# Patient Record
Sex: Female | Born: 1990 | Hispanic: No | Marital: Married | Smoking: Never smoker
Health system: Southern US, Community
[De-identification: ages and names within clinical notes are randomized; demographics above are authoritative.]

## PROBLEM LIST (undated history)

## (undated) DIAGNOSIS — R87629 Unspecified abnormal cytological findings in specimens from vagina: Secondary | ICD-10-CM

## (undated) HISTORY — DX: Unspecified abnormal cytological findings in specimens from vagina: R87.629

## (undated) HISTORY — PX: NO PAST SURGERIES: SHX2092

---

## 2017-10-18 DIAGNOSIS — K5909 Other constipation: Secondary | ICD-10-CM

## 2017-10-18 HISTORY — DX: Other constipation: K59.09

## 2018-12-16 ENCOUNTER — Other Ambulatory Visit: Payer: Self-pay | Admitting: Obstetrics and Gynecology

## 2018-12-16 DIAGNOSIS — N6320 Unspecified lump in the left breast, unspecified quadrant: Secondary | ICD-10-CM

## 2018-12-19 ENCOUNTER — Other Ambulatory Visit: Payer: Self-pay

## 2019-01-13 ENCOUNTER — Ambulatory Visit
Admission: RE | Admit: 2019-01-13 | Discharge: 2019-01-13 | Disposition: A | Discharge status: Home / Self Care | Payer: BC Managed Care – PPO | Source: Ambulatory Visit | Attending: Obstetrics and Gynecology | Admitting: Obstetrics and Gynecology

## 2019-01-13 ENCOUNTER — Encounter (HOSPITAL_COMMUNITY): Payer: Self-pay

## 2019-01-13 DIAGNOSIS — N6320 Unspecified lump in the left breast, unspecified quadrant: Secondary | ICD-10-CM | POA: Diagnosis present

## 2019-01-15 ENCOUNTER — Other Ambulatory Visit: Payer: Self-pay | Admitting: Obstetrics and Gynecology

## 2019-01-15 DIAGNOSIS — N6320 Unspecified lump in the left breast, unspecified quadrant: Secondary | ICD-10-CM

## 2019-05-15 ENCOUNTER — Ambulatory Visit: Payer: BC Managed Care – PPO | Attending: Obstetrics and Gynecology

## 2019-05-15 ENCOUNTER — Other Ambulatory Visit: Payer: Self-pay

## 2019-05-15 DIAGNOSIS — M778 Other enthesopathies, not elsewhere classified: Secondary | ICD-10-CM

## 2019-05-15 DIAGNOSIS — R102 Pelvic and perineal pain: Secondary | ICD-10-CM | POA: Insufficient documentation

## 2019-05-15 DIAGNOSIS — M62838 Other muscle spasm: Secondary | ICD-10-CM | POA: Diagnosis present

## 2019-05-15 DIAGNOSIS — K5900 Constipation, unspecified: Secondary | ICD-10-CM | POA: Insufficient documentation

## 2019-05-15 HISTORY — DX: Other enthesopathies, not elsewhere classified: M77.8

## 2019-05-15 NOTE — Therapy (Signed)
[facility] MAIN [facility] SERVICES [address] [place], [place], [address] Phone: [phone]   Fax:  [phone]  Physical Therapy Evaluation  The patient has been informed of current processes in place at Outpatient Rehab to protect patients from Covid-19 exposure including social distancing, schedule modifications, and new cleaning procedures. After discussing their particular risk with a therapist based on the patient's personal risk factors, the patient has decided to proceed with in-person therapy.   Patient Details  Name: Joyce Stewart MRN: [id number] Date of Birth: 01-May-1990 No data recorded  Encounter Date: 05/15/2019  PT End of Session - 05/16/19 0932    Visit Number  1    Number of Visits  10    Date for PT Re-Evaluation  07/25/19    Authorization - Visit Number  1    Authorization - Number of Visits  10    PT Start Time  1730    PT Stop Time  1830    PT Time Calculation (min)  60 min    Activity Tolerance  Patient tolerated treatment well;No increased pain    Behavior During Therapy  [facility] for tasks assessed/performed       History reviewed. No pertinent past medical history.  History reviewed. No pertinent surgical history.  There were no vitals filed for this visit.  Pelvic Floor Physical Therapy Evaluation and Assessment  SCREENING  Falls in last 6 mo: no Red Flags:  Have you had any night sweats? no Unexplained weight loss? no Saddle anesthesia? no Unexplained changes in bowel or bladder habits? no  SUBJECTIVE  Patient reports: Has pelvic pain that is sharp/intense ~ 2 times a day that seems to be worse if she has not had a BM that week or if she has done an Abdominal workout. It seems to have started after her IUD was placed but it is correctly in place via Ultrasound in Oct. 2020.  Constipation started ~ 6 years ago. Had fecal impaction following a vacation. Took 3 weeks of OTC Coca Cola. Takes Metamucil  daily, if she has not had a BM in 1or more weeks she takes Mirilax. Has been taking colace for ~ 4 years and started Senna ~ 6 months ago. Is not getting the urge to have a BM at all, has to track her BM's   Works out 3 day a week at a high-intensity boot-camp, does Yoga 1 day per week and walks the dog every day for 30-40 min.   Takes 5 min. Breaks, reads, goes for a walk at lunch and does yoga for stress.  Knows that she tightens her feet a lot during the day.  Precautions:  none  Social/Family/Vocational History:   Works a Ship broker.   Recent Procedures/Tests/Findings:  X-ray n 2015 that showed fecal impaction. Ultrasound in Nov. 2020  Obstetrical History: none  Gynecological History: Fibroid adenoma in L breast  Urinary History: Urinary urgency, frequency is ~ 1x/hr. 8-10 glasses per day.  3 cups of tea, 1 cup of coffee, 8-10, 1 glass of wine.   Gastrointestinal History: Has a BM 2-3 times per week over the past 2 weeks. The longest time she has gone without having a BM is ~ 3 weeks.   Sexual activity/pain: Yes, with deeper thrusting. Hurts more if she has not had a BM.  Location of pain: lower abdomen/pelvis B and taint. (vaginal deep) Current pain:  0/10  Max pain:  4-6/10 (3-4) Least pain:  0/10 Ashby Dawes of  pain: Sharp shooting stabbing  Patient Goals: To be able to have a at least BM 4 times per week. And see if pelvic pain can be resolved.   OBJECTIVE  Posture/Observations:  Sitting: Slumped, FHP Standing: R knee bent, ASIS level, PSIS level, weight on heels and outer edge of feet. Anterior pelvic tilt, glute gripping Prone: coccyx hyperflexed   Palpation/Segmental Motion/Joint Play: Decreased sacral mobility at all borders, pain on L>R,   Special tests:   Forward bend: no scoliosis noted.   Range of Motion/Flexibilty:  Spine: WNL Hips:   Strength/MMT: Deferred to follow up LE MMT  LE MMT Left Right  Hip flex:  (L2) /5 /5   Hip ext: /5 /5  Hip abd: /5 /5  Hip add: /5 /5  Hip IR /5 /5  Hip ER /5 /5     Abdominal:  Palpation:TTP to L Iliacus, B psoas (proximal) Diastasis:  none  Pelvic Floor External Exam: (performed over clothing Introitus Appears:  Skin integrity:  Palpation: TTP to B STP and IC Cough: Prolapse visible?: Scar mobility:  Internal Vaginal Exam: Deferred to follow-up Strength (PERF):  Symmetry: Palpation: Prolapse:   Internal Rectal Exam: Strength (PERF): Symmetry: Palpation: Prolapse:   Gait Analysis: deferred to follow up   Pelvic Floor Outcome Measures:  FOTO PFDI Pain: 4, Bowel Cnst: 41, PFDI Bowel: 4  INTERVENTIONS THIS SESSION: Self-care: Educated on the structure and function of the pelvic floor in relation to their symptoms as well as the POC, and initial HEP in order to set patient expectations and understanding from which we will build on in the future sessions.  Therex: Educated on and practiced child's pose and frog stretch as well as diaphragmatic breathing To improve muscle length and allow for improved balance of musculature  Acting upon the pelvis to decrease Sx.  Total time: 60 min.                  Objective measurements completed on examination: See above findings.                PT Short Term Goals - 05/16/19 0950      PT SHORT TERM GOAL #1   Title  Patient will demonstrate improved Sacral mobility and balance of musculature surrounding the pelvis to facilitate decreased PFM spasms and decrease pelvic pain.    Baseline  Anterior pelvic tilt, spasms surrounding L>R hip, LB tightness    Time  5    Period  Weeks    Status  New    Target Date  06/20/19      PT SHORT TERM GOAL #2   Title  Patient will demonstrate a coordinated contraction, relaxation, and bulge of the pelvic floor muscles to demonstrate functional recruitment and motion and allow for improved ease of BM.Marland Kitchen    Baseline  Pt. Hx. suggests poor PFM  coordination and sensation leading to chronic constipation.    Time  5    Period  Weeks    Status  New    Target Date  06/20/19      PT SHORT TERM GOAL #3   Title  Patient will demonstrate HEP x1 in the clinic to demonstrate understanding and proper form to allow for further improvement.    Baseline  Pt. lacks knowledge of what therapeutic exercises will help decrease her Sx. , Pt. does high-impact exercise 3x/wk    Time  5    Period  Weeks    Status  New  Target Date  06/20/19        PT Long Term Goals - 05/16/19 0954      PT LONG TERM GOAL #1   Title  Patient will report no pain with intercourse to demonstrate improved functional ability.    Baseline  Pt. has pain with deeper thrusting    Time  10    Period  Weeks    Status  New    Target Date  07/25/19      PT LONG TERM GOAL #2   Title  Patient will report having BM's at least every-other day with consistency between [facility] stool scale 3-5 over the prior week to demonstrate decreased constipation.    Baseline  Pt. does not have sensation to have BM, going ~ 3x/week with use of Metamucil, Senna, and colace    Time  10    Period  Weeks    Status  New    Target Date  07/25/19      PT LONG TERM GOAL #3   Title  Pt. will improve in FOTO Bowel Cnst score by 20 points to demonstrate improved function.    Baseline  FOTO PFDI Pain: 4, Bowel Cnst: 41, PFDI Bowel: 4    Time  10    Period  Weeks    Status  New    Target Date  07/25/19             Plan - 05/16/19 0932    Clinical Impression Statement  Pt. is a 29 y/o female who presents to PT today with cheif c/o chronic constipation without sensation of urge, pelvic pain, and dyspareunia. She has no relevant PMH and her clinical exam revealed a Flexed coccyx, decreased sacral mobility, PFM spasms, feet gripping, anterior pelvic tilt, and breathing dysfunction. She will benefit from skilled pelvic PT to address the noted deficits and to continue to assess for and address  other potential causes of pain and constipation.    Examination-Activity Limitations  Toileting    Examination-Participation Restrictions  Interpersonal Relationship    Stability/Clinical Decision Making  Stable/Uncomplicated    Clinical Decision Making  Low    Rehab Potential  Good    PT Frequency  1x / week    PT Duration  Other (comment)   10 weeks   PT Treatment/Interventions  ADLs/Self Care Home Management;Biofeedback;Ultrasound;Traction;Moist Heat;Electrical Stimulation;Gait training;Functional mobility training;Neuromuscular re-education;Therapeutic exercise;Therapeutic activities;Patient/family education;Manual techniques;Passive range of motion;Dry needling;Taping;Spinal Manipulations;Joint Manipulations    PT Next Visit Plan  Scaral mobs, cupping and/or TPDN to low back    PT Home Exercise Plan  child's pose, frog pose, diaphragmatic breathing    Consulted and Agree with Plan of Care  Patient       Patient will benefit from skilled therapeutic intervention in order to improve the following deficits and impairments:  Hypomobility, Improper body mechanics, Decreased activity tolerance, Decreased coordination, Increased fascial restricitons, Impaired flexibility, Postural dysfunction, Pain  Visit Diagnosis: Pelvic pain  Other muscle spasm     Problem List Patient Active Problem List   Diagnosis Date Noted  . Constipation 05/15/2019  . Tendonitis of finger 05/15/2019  . Pelvic pain 05/15/2019   Willa Rough DPT, ATC Willa Rough 05/16/2019, 9:57 AM  [place] MAIN [facility] SERVICES [address] [place], [place], [address] Phone: [phone]   Fax:  [phone]  Name: Joyce Stewart MRN: [id number] Date of Birth: [birth year]

## 2019-05-15 NOTE — Patient Instructions (Signed)
Child's Pose Pelvic Floor Lengthening    Sit in knee-chest position and reach arms forward. Separate knees for comfort. Hold position for _5__ breaths. Repeat _2-3__ times. Do _1-2__ times per day.   Take 5 deep breaths, allowing gravity to gently pull you into a deeper stretch with each breath. Repeat 2-3 times, once a day.  

## 2019-05-22 ENCOUNTER — Ambulatory Visit: Payer: BC Managed Care – PPO

## 2019-05-29 ENCOUNTER — Other Ambulatory Visit: Payer: Self-pay

## 2019-05-29 ENCOUNTER — Ambulatory Visit: Payer: BC Managed Care – PPO

## 2019-05-29 DIAGNOSIS — M62838 Other muscle spasm: Secondary | ICD-10-CM

## 2019-05-29 DIAGNOSIS — R102 Pelvic and perineal pain: Secondary | ICD-10-CM

## 2019-05-29 NOTE — Patient Instructions (Addendum)
This may look different for you now because you are still balancing at the point where you feel the stretch, working your way forward toward putting your knees down.    Bowel retraining program: 1) Start by drinking a hot (optionally caffeinated) beverage 2) Do your "I love you" colonic massage  3) Go for a short walk 4) Go to the toilet and sit with feet up on squatty potty and relaxing forward on your knees with tall spine. Take deep, lengthening, breaths and allow up to 10 minutes to have a BM without "straining" before you move on with the day.       The "I Love You" massage for your colon  Start by resting or lying quietly. 1. Using your fingertips, you apply light pressure in a stroking motion. 2. Start with your hands on the left hand side of your abdomen, below the rib cage, and stroke or make small circles down towards your left hip. This is the "I" of the "I Love You" massage. 3. Next, you are going to make the strokes in an upside down "L" shape. Run your fingertips from the right side of your upper abdomen, across under your ribs, and down the left side. 4. Now you are going to run through the whole path. This is the "U". Start on the bottom right of your abdomen. Stroke up the right side, across under the rib cage, and down the left side.   5. Finally, Using your fingertips, you apply light pressure in small circles  through the whole "U" path to "wake up" the smooth muscles of the intestines and get things moving.    Up the right, across under the rib cage, down the left and inwards, moving in a clockwise motion (if you are looking down upon your own abdomen)  Essentially you are massaging along the path of your large intestine. Our colon starts roughly in the bottom right of our abdomen, travels up the right hand side, turns and runs across below our rib cage, and then down the left side and in towards the pubic bone. When I teach this massage for people to do at home  I have them start with 10 minutes. However, anecdotally, many people tell me 15-20 minutes really gets things going! After about 5 minutes of this massage my insides start gurgling and making noises. For many years I worked as a Adult nurse in a hospital setting. A big problem is constipation resulting from either medication side effects, post surgical changes, or the fact that in general people in the hospital don't move as much (and exercise such as walking also helps regulate our digestive system). One of the first "exercises" I would teach them is how to do the "I Love You" abdominal massage. Time and time again I have people come back to me and say that massaging their abdominal tissue helped their digestive issues. Give it a try today!  *Adapted from article written by Harriet Butte, PT, DPT  Flexors, Lunge  Hip Flexor Stretch: Proposal Pose    Maintain pelvic tuck under, lift pubic bone toward navel. Engage posterior hip muscles (firm glute muscles of leg in back position) and shift forward until you feel stretch on front of leg that is down. To increase stretch, maintain balance and ease hips forward. You may use one hand on a chair for balance if needed. Hold for __5__ breaths. Repeat __2-3__ times each leg.  Do _1-2__ times per day.  **AVOID JUMPING  EXERCISE FOR AT LEAST 2 WEEKS.

## 2019-05-29 NOTE — Therapy (Addendum)
[place] [facility] MAIN [facility] SERVICES [address] [place], [place], [address] Phone: [phone]   Fax:  [phone]  Physical Therapy Treatment  The patient has been informed of current processes in place at Outpatient Rehab to protect patients from Covid-19 exposure including social distancing, schedule modifications, and new cleaning procedures. After discussing their particular risk with a therapist based on the patient's personal risk factors, the patient has decided to proceed with in-person therapy.   Patient Details  Name: Joyce Stewart MRN: [id number] Date of Birth: [birth year] No data recorded  Encounter Date: 05/29/2019  PT End of Session - 05/30/19 1611    Visit Number  2    Number of Visits  10    Date for PT Re-Evaluation  07/25/19    Authorization - Visit Number  2    Authorization - Number of Visits  10    PT Start Time  1705    PT Stop Time  1809    PT Time Calculation (min)  64 min    Activity Tolerance  Patient tolerated treatment well;No increased pain    Behavior During Therapy  [facility] for tasks assessed/performed       No past medical history on file.  No past surgical history on file.  There were no vitals filed for this visit.    Pelvic Floor Physical Therapy Treatment Note  SCREENING  Changes in medications, allergies, or medical history?: none    SUBJECTIVE  Patient reports: No change since prior visit.  Precautions:  none  Sexual activity/pain: Yes, with deeper thrusting. Hurts more if she has not had a BM.  Location of pain: lower abdomen/pelvis B and taint. (vaginal deep) Current pain: 0/10  Max pain: 4-6/10 Least pain: 0/10 Nature of pain:Sharp shooting stabbing  ** no pain following session  Patient Goals: To be able to have a at least BM 4 times per week. And see if pelvic pain can be resolved.  OBJECTIVE  Changes in: Posture/Observations:  hyperlordosis  Range of Motion/Flexibilty:   Decreased mobility through T-L junction with referred pressure into abdomen an pelvis.  Strength/MMT:  LE MMT:  Pelvic floor:  Abdominal:  Decreased rib and abdominal excursion with Inhale  Palpation:  Decreased fascial mobility through T-L junction without tenderness.  Gait Analysis:  INTERVENTIONS THIS SESSION: Self-care:  Discussed decreasing high-intensity exercise short-term (boot camp) to improve relaxation of PFM, educated on bowel retraining, squatty potty, ILY colon masssage toe-yoga Manual: perfomed sacral and T-L junction mobs and cupping to low/mid back to improve mobility of joint and surrounding connective tissue and decrease pressure on nerve roots for improved conductivity and function of down-stream tissues.  Therex: Educated on and practiced plantar fascia yoga stretch and hip-flexor stretch to decrease tension on nerves that innervate or affect PFM tension.  Total time: 64 min.                            PT Short Term Goals - 05/16/19 0950      PT SHORT TERM GOAL #1   Title  Patient will demonstrate improved Sacral mobility and balance of musculature surrounding the pelvis to facilitate decreased PFM spasms and decrease pelvic pain.    Baseline  Anterior pelvic tilt, spasms surrounding L>R hip, LB tightness    Time  5    Period  Weeks    Status  New    Target Date  06/20/19  PT SHORT TERM GOAL #2   Title  Patient will demonstrate a coordinated contraction, relaxation, and bulge of the pelvic floor muscles to demonstrate functional recruitment and motion and allow for improved ease of BM.Marland Kitchen    Baseline  Pt. Hx. suggests poor PFM coordination and sensation leading to chronic constipation.    Time  5    Period  Weeks    Status  New    Target Date  06/20/19      PT SHORT TERM GOAL #3   Title  Patient will demonstrate HEP x1 in the clinic to demonstrate understanding and proper form to allow for further improvement.    Baseline   Pt. lacks knowledge of what therapeutic exercises will help decrease her Sx. , Pt. does high-impact exercise 3x/wk    Time  5    Period  Weeks    Status  New    Target Date  06/20/19        PT Long Term Goals - 05/16/19 0954      PT LONG TERM GOAL #1   Title  Patient will report no pain with intercourse to demonstrate improved functional ability.    Baseline  Pt. has pain with deeper thrusting    Time  10    Period  Weeks    Status  New    Target Date  07/25/19      PT LONG TERM GOAL #2   Title  Patient will report having BM's at least every-other day with consistency between [facility] stool scale 3-5 over the prior week to demonstrate decreased constipation.    Baseline  Pt. does not have sensation to have BM, going ~ 3x/week with use of Metamucil, Senna, and colace    Time  10    Period  Weeks    Status  New    Target Date  07/25/19      PT LONG TERM GOAL #3   Title  Pt. will improve in FOTO Bowel Cnst score by 20 points to demonstrate improved function.    Baseline  FOTO PFDI Pain: 4, Bowel Cnst: 41, PFDI Bowel: 4    Time  10    Period  Weeks    Status  New    Target Date  07/25/19            Plan - 05/30/19 1611    Clinical Impression Statement  Pt. Responded well to all interventions today, demonstrating improved T-L junction and sacral mobility, improved fascial mobility,  as well as understanding and correct performance of all education and exercises provided today. They will continue to benefit from skilled physical therapy to work toward remaining goals and maximize function as well as decrease likelihood of symptom increase or recurrence.     PT Next Visit Plan  TPDN to low back, hip flexors and adductors PRN    PT Home Exercise Plan  child's pose, frog pose, diaphragmatic breathing,       Patient will benefit from skilled therapeutic intervention in order to improve the following deficits and impairments:     Visit Diagnosis: Pelvic pain  Other muscle  spasm     Problem List Patient Active Problem List   Diagnosis Date Noted  . Constipation 05/15/2019  . Tendonitis of finger 05/15/2019  . Pelvic pain 05/15/2019   Cleophus Molt DPT, ATC Cleophus Molt 05/30/2019, 4:16 PM  [place] [facility] MAIN [facility] SERVICES [address] [place], [place], [address] Phone:  [phone]   Fax:  [phone]  Name: Melisha Eggleton MRN: [id number] Date of Birth: [birth year]

## 2019-06-01 ENCOUNTER — Ambulatory Visit: Payer: BC Managed Care – PPO | Attending: Internal Medicine

## 2019-06-01 DIAGNOSIS — Z23 Encounter for immunization: Secondary | ICD-10-CM | POA: Insufficient documentation

## 2019-06-01 NOTE — Progress Notes (Signed)
   Covid-19 Vaccination Clinic  Name:  Athalee Esterline    MRN: [id number] DOB: 1991/02/29  06/01/2019  Ms. Webb Silversmith was observed post Covid-19 immunization for 15 minutes without incidence. She was provided with Vaccine Information Sheet and instruction to access the V-Safe system.   Ms. Webb Silversmith was instructed to call 911 with any severe reactions post vaccine: Marland Kitchen Difficulty breathing  . Swelling of your face and throat  . A fast heartbeat  . A bad rash all over your body  . Dizziness and weakness    Immunizations Administered    Name Date Dose VIS Date Route   Pfizer COVID-19 Vaccine 06/01/2019 12:01 PM 0.3 mL 03/14/2019 Intramuscular   Manufacturer: [employer], [employer]   Lot: [id number]   NDC: [address]-9

## 2019-06-05 ENCOUNTER — Ambulatory Visit: Payer: BC Managed Care – PPO | Attending: Obstetrics and Gynecology

## 2019-06-05 ENCOUNTER — Other Ambulatory Visit: Payer: Self-pay

## 2019-06-05 DIAGNOSIS — M62838 Other muscle spasm: Secondary | ICD-10-CM | POA: Diagnosis present

## 2019-06-05 DIAGNOSIS — R102 Pelvic and perineal pain: Secondary | ICD-10-CM | POA: Diagnosis present

## 2019-06-05 NOTE — Patient Instructions (Signed)
Pelvic Tilt With Pelvic Floor (Hook-Lying)        Lie with hips and knees bent. Squeeze lower tummy muscles and flatten low back  (tuck under) while breathing out (like you are blowing out candles) so that pelvis tilts. Inhale and relax. Repeat _15x2__ times. Do __1-2_ times a day.  *low tummy muscles first, then ribcage

## 2019-06-05 NOTE — Therapy (Signed)
[place] [facility] MAIN [facility] SERVICES [address] [place], [place], [address] Phone: [phone]   Fax:  [phone]  Physical Therapy Treatment  The patient has been informed of current processes in place at Outpatient Rehab to protect patients from Covid-19 exposure including social distancing, schedule modifications, and new cleaning procedures. After discussing their particular risk with a therapist based on the patient's personal risk factors, the patient has decided to proceed with in-person therapy.   Patient Details  Name: Joyce Stewart MRN: [id number] Date of Birth: 04-01-1991 No data recorded  Encounter Date: 06/05/2019  PT End of Session - 06/06/19 1031    Visit Number  3    Number of Visits  10    Date for PT Re-Evaluation  07/25/19    Authorization - Visit Number  3    Authorization - Number of Visits  10    PT Start Time  1730    PT Stop Time  1830    PT Time Calculation (min)  60 min    Activity Tolerance  Patient tolerated treatment well;No increased pain    Behavior During Therapy  [facility] for tasks assessed/performed       No past medical history on file.  No past surgical history on file.  There were no vitals filed for this visit.     Pelvic Floor Physical Therapy Treatment Note  SCREENING  Changes in medications, allergies, or medical history?: none    SUBJECTIVE  Patient reports: Has been having pain in B feet and pelvis. Exercised yesterday and for the felt to the first time like her muscles in her back were sore/tired. It was just while she was exercising. Has been working on the toileting technique but did not do it last night. She had a BM today and on Monday but was "pooping pebbles" both times.  Drinking 7-9 cups of water mostly between 3-10 pm.     Precautions:  none  Sexual activity/pain: Yes, with deeper thrusting. Hurts more if she has not had a BM.  Location of pain: lower abdomen/pelvis B and taint.  (vaginal deep) Current pain: 0/10  Max pain: 7/10 Least pain: 0/10 Nature of pain:Sharp shooting stabbing  ** sore in the back from where we worked   Patient Goals: To be able to have a at least BM 4 times per week. And see if pelvic pain can be resolved.  OBJECTIVE  Changes in: Posture/Observations:  hyperlordosis  Range of Motion/Flexibilty:  Decreased mobility through T-L junction with referred pressure into abdomen an pelvis.  Strength/MMT:  LE MMT:  Pelvic floor:  Abdominal:  Decreased rib and abdominal excursion with Inhale/exhale, difficulty recruiting and sustaining TA contraction (improved following treatment by ~ 30%).  2 finger diastasis under umbilicus, 1 finger above.  Palpation: TTP to B Iliacus and Psoas  Gait Analysis:  INTERVENTIONS THIS SESSION: Manual: Performed TP release to B Iliacus and Psoas to decrease spasm and pain and allow for decreased tension on the T/L junction and genitofemoral nerve for Improved function and decreased symptoms.  Therex: Educated on and practiced deep core recruitment (posterior pelvic tilts with TA then obliques), and educated on and practiced mini-marches (but not given as HEP yet) to improve strength of muscles opposing tight musculature to allow reciprocal inhibition to improve balance of deep-core musculature and improve overall posture for optimal musculature length-tension relationship and function.  Self-care: Educated on diastasis and how to modify exercises to make sure it does not worsen. Discussed  taking mirilax regularly until she has a BM and then on a schedule to maintain smaller, more regular BM's and help the colon shrink back down to a normal size as we get the diastasis and spine worked out to help her maintain improved regularity.  Total time: 60 min.                           PT Short Term Goals - 05/16/19 0950      PT SHORT TERM GOAL #1   Title  Patient will demonstrate  improved Sacral mobility and balance of musculature surrounding the pelvis to facilitate decreased PFM spasms and decrease pelvic pain.    Baseline  Anterior pelvic tilt, spasms surrounding L>R hip, LB tightness    Time  5    Period  Weeks    Status  New    Target Date  06/20/19      PT SHORT TERM GOAL #2   Title  Patient will demonstrate a coordinated contraction, relaxation, and bulge of the pelvic floor muscles to demonstrate functional recruitment and motion and allow for improved ease of BM.Marland Kitchen    Baseline  Pt. Hx. suggests poor PFM coordination and sensation leading to chronic constipation.    Time  5    Period  Weeks    Status  New    Target Date  06/20/19      PT SHORT TERM GOAL #3   Title  Patient will demonstrate HEP x1 in the clinic to demonstrate understanding and proper form to allow for further improvement.    Baseline  Pt. lacks knowledge of what therapeutic exercises will help decrease her Sx. , Pt. does high-impact exercise 3x/wk    Time  5    Period  Weeks    Status  New    Target Date  06/20/19        PT Long Term Goals - 05/16/19 0954      PT LONG TERM GOAL #1   Title  Patient will report no pain with intercourse to demonstrate improved functional ability.    Baseline  Pt. has pain with deeper thrusting    Time  10    Period  Weeks    Status  New    Target Date  07/25/19      PT LONG TERM GOAL #2   Title  Patient will report having BM's at least every-other day with consistency between [facility] stool scale 3-5 over the prior week to demonstrate decreased constipation.    Baseline  Pt. does not have sensation to have BM, going ~ 3x/week with use of Metamucil, Senna, and colace    Time  10    Period  Weeks    Status  New    Target Date  07/25/19      PT LONG TERM GOAL #3   Title  Pt. will improve in FOTO Bowel Cnst score by 20 points to demonstrate improved function.    Baseline  FOTO PFDI Pain: 4, Bowel Cnst: 41, PFDI Bowel: 4    Time  10    Period   Weeks    Status  New    Target Date  07/25/19            Plan - 06/06/19 1031    Clinical Impression Statement  Pt. Responded well to all interventions today, demonstrating improved deep-core recruitment, decreased spasm and pain, and as well as understanding and correct performance  of all education and exercises provided today. They will continue to benefit from skilled physical therapy to work toward remaining goals and maximize function as well as decrease likelihood of symptom increase or recurrence.     PT Next Visit Plan  TPDN to low back/TL junction, adductors PRN, increase deep-core challenge    PT Home Exercise Plan  child's pose, frog pose, diaphragmatic breathing, toe stretch, hip-flexor stretch, Posterior pelvic tilt with TA/Oblique timing    Consulted and Agree with Plan of Care  Patient       Patient will benefit from skilled therapeutic intervention in order to improve the following deficits and impairments:     Visit Diagnosis: Pelvic pain  Other muscle spasm     Problem List Patient Active Problem List   Diagnosis Date Noted  . Constipation 05/15/2019  . Tendonitis of finger 05/15/2019  . Pelvic pain 05/15/2019   Willa Rough DPT, ATC Willa Rough 06/06/2019, 10:34 AM  [place] MAIN [facility] SERVICES [address] [place], [place], [address] Phone: [phone]   Fax:  [phone]  Name: Joyce Stewart MRN: [id number] Date of Birth: 03-19-1991

## 2019-06-12 ENCOUNTER — Ambulatory Visit: Payer: BC Managed Care – PPO

## 2019-06-12 ENCOUNTER — Other Ambulatory Visit: Payer: Self-pay

## 2019-06-12 DIAGNOSIS — M62838 Other muscle spasm: Secondary | ICD-10-CM

## 2019-06-12 DIAGNOSIS — R102 Pelvic and perineal pain: Secondary | ICD-10-CM | POA: Diagnosis not present

## 2019-06-12 NOTE — Therapy (Signed)
[place] [facility] MAIN [facility] SERVICES [address] [place], [place], [address] Phone: [phone]   Fax:  [phone]  Physical Therapy Treatment  The patient has been informed of current processes in place at Outpatient Rehab to protect patients from Covid-19 exposure including social distancing, schedule modifications, and new cleaning procedures. After discussing their particular risk with a therapist based on the patient's personal risk factors, the patient has decided to proceed with in-person therapy.   Patient Details  Name: Joyce Stewart MRN: [id number] Date of Birth: 04-28-1990 No data recorded  Encounter Date: 06/12/2019  PT End of Session - 06/12/19 1735    Visit Number  4    Number of Visits  10    Date for PT Re-Evaluation  07/25/19    Authorization - Visit Number  4    Authorization - Number of Visits  10    Progress Note Due on Visit  10    PT Start Time  1730    PT Stop Time  1830    PT Time Calculation (min)  60 min    Activity Tolerance  Patient tolerated treatment well;No increased pain    Behavior During Therapy  [facility] for tasks assessed/performed       No past medical history on file.  No past surgical history on file.  There were no vitals filed for this visit.     Pelvic Floor Physical Therapy Treatment Note  SCREENING  Changes in medications, allergies, or medical history?: none    SUBJECTIVE  Patient reports: Thinks she needs to stop the toileting routine because she thinks she gave herself a hemmorhoid. Did not listen to the part about relaxing during the time she is sitting for 10 minutes. Had a BM on Friday, Saturday, Monday and Wednesday. Has take Mrilax every-other day. Has vaginal/period cramps that come and go. Her feet are still really achy, just around the heels. It is most painful when she is barefoot.   Precautions:  none  Sexual activity/pain: Yes, with deeper thrusting. Hurts more if she has not had  a BM.  Location of pain: lower abdomen/pelvis B and taint. (vaginal deep) Current pain: 0/10  Max pain: 4/10 Least pain: 0/10 Nature of pain:Sharp shooting stabbing  ** sore in the back from where we worked   Patient Goals: To be able to have a at least BM 4 times per week. And see if pelvic pain can be resolved.  OBJECTIVE  Changes in: Posture/Observations:  hyperlordosis  Range of Motion/Flexibilty:  Decreased mobility through T-L junction with referred pressure into abdomen an pelvis.  Strength/MMT:  LE MMT:  Pelvic floor: Range of Motion/Flexibilty:  Spine: Hips:   Strength/MMT:  LE MMT  LE MMT Left Right  Hip flex:  (L2) /5 /5  Hip ext: /5 /5  Hip abd: /5 /5  Hip add: /5 /5  Hip IR /5 /5  Hip ER /5 /5     Abdominal:  Palpation: Diastasis:  Pelvic Floor External Exam: Introitus Appears: WNL Skin integrity: WNL Palpation: no TTP Cough: not assessed Prolapse visible?: none Scar mobility: N/A  Internal Vaginal Exam: Strength (PERF): 5/5, 5 sec, 1 time Symmetry: L>R for TTP, greatest at L posterior PR/PC Palpation: TTP throughout all PFM Prolapse: none   Internal Rectal Exam: Deferred indefinately Strength (PERF): Symmetry: Palpation: Prolapse:   Abdominal:  Decreased rib and abdominal excursion with Inhale/exhale, difficulty recruiting and sustaining TA contraction (improved following treatment by ~ 30%).  2 finger  diastasis under umbilicus, 1 finger above.  Palpation: TTP to B Iliacus and Psoas  Gait Analysis:  INTERVENTIONS THIS SESSION: Manual:  TP release to B adductor revis/pectineus, grade 3-4 PA mobs to sacrum, palpated lumbar extensors, assessed PFM and performed TP release to R Posterior and anterior PR/PC, and posterior fourchette to decrease spasm and pain and allow for improved balance of musculature for improved function and decreased symptoms.  Therex: Educated on and practiced standing calf-stretch on the wall to  decrease tension through plantar fascia and decrease heel pain, reviewed deep-core coordination.  Self-care: Educated on toilet meditation, using preparation H, not wearing pedicure separators all the time, not straining on the toilet, and self posterior fourchette TP release to allow for ease of BM and decreased constipation and  hemorrhoids.   Total time: 60 min.                           PT Short Term Goals - 05/16/19 0950      PT SHORT TERM GOAL #1   Title  Patient will demonstrate improved Sacral mobility and balance of musculature surrounding the pelvis to facilitate decreased PFM spasms and decrease pelvic pain.    Baseline  Anterior pelvic tilt, spasms surrounding L>R hip, LB tightness    Time  5    Period  Weeks    Status  New    Target Date  06/20/19      PT SHORT TERM GOAL #2   Title  Patient will demonstrate a coordinated contraction, relaxation, and bulge of the pelvic floor muscles to demonstrate functional recruitment and motion and allow for improved ease of BM.Marland Kitchen    Baseline  Pt. Hx. suggests poor PFM coordination and sensation leading to chronic constipation.    Time  5    Period  Weeks    Status  New    Target Date  06/20/19      PT SHORT TERM GOAL #3   Title  Patient will demonstrate HEP x1 in the clinic to demonstrate understanding and proper form to allow for further improvement.    Baseline  Pt. lacks knowledge of what therapeutic exercises will help decrease her Sx. , Pt. does high-impact exercise 3x/wk    Time  5    Period  Weeks    Status  New    Target Date  06/20/19        PT Long Term Goals - 05/16/19 0954      PT LONG TERM GOAL #1   Title  Patient will report no pain with intercourse to demonstrate improved functional ability.    Baseline  Pt. has pain with deeper thrusting    Time  10    Period  Weeks    Status  New    Target Date  07/25/19      PT LONG TERM GOAL #2   Title  Patient will report having BM's at least  every-other day with consistency between [facility] stool scale 3-5 over the prior week to demonstrate decreased constipation.    Baseline  Pt. does not have sensation to have BM, going ~ 3x/week with use of Metamucil, Senna, and colace    Time  10    Period  Weeks    Status  New    Target Date  07/25/19      PT LONG TERM GOAL #3   Title  Pt. will improve in FOTO Bowel Cnst score by 20  points to demonstrate improved function.    Baseline  FOTO PFDI Pain: 4, Bowel Cnst: 41, PFDI Bowel: 4    Time  10    Period  Weeks    Status  New    Target Date  07/25/19            Plan - 06/12/19 1737    Clinical Impression Statement  Pt. Responded well to all interventions today, demonstrating decreased PFM spasm as well as understanding and correct performance of all education and exercises provided today. They will continue to benefit from skilled physical therapy to work toward remaining goals and maximize function as well as decrease likelihood of symptom increase or recurrence.     PT Next Visit Plan  Further internal TP release, TPDN to low back/TL junction to allow for improved sensation PRN, increase deep-core challenge    PT Home Exercise Plan  child's pose, frog pose, diaphragmatic breathing, toe stretch, hip-flexor stretch, Posterior pelvic tilt with TA/Oblique timing, toilet meditation, self posterior fourchette release, calf stretch on wall    Consulted and Agree with Plan of Care  Patient       Patient will benefit from skilled therapeutic intervention in order to improve the following deficits and impairments:     Visit Diagnosis: Pelvic pain  Other muscle spasm     Problem List Patient Active Problem List   Diagnosis Date Noted  . Constipation 05/15/2019  . Tendonitis of finger 05/15/2019  . Pelvic pain 05/15/2019   Cleophus Molt DPT, ATC Cleophus Molt 06/13/2019, 9:30 AM  [place] [facility] MAIN [facility] SERVICES [address]  [place], [place], [address] Phone: [phone]   Fax:  [phone]  Name: Joyce Stewart MRN: [id number] Date of Birth: March 16, 1991

## 2019-06-12 NOTE — Patient Instructions (Addendum)
1) don't strain while on the toilet!  This meditation is from [employer], physical therapist and yoga instructor.  Patients, yoga students and pelvic health practitioners that I have shared this with have seemed to really enjoy it, find it easy to practice and teach, and report its usefulness! It involves 6 stages, using the acronym "AIRBAG" to help you remember! If you are sitting on the toilet, it is a good idea to place your feet up on some blocks so that your knees are slightly higher than your hips. This position can help enhance your PFMs to relax and allow for proper elimination, particularly for bowel movements. If you are standing during urination, these toilet meditation stages of "AIRBAG" can still be performed: A = Awareness: become present and connected to your body as best as you can. A brief body scan from head to toe simply observing sensations that you may be experiencing, without judgement, both internally (interoceptive awareness) and externally (such as the sensations at the soles of the feet weight bearing on the supporting surface, or sensations at the thighs as they weight bear on the toilet seat, or how you are holding your arms, or any tension in the jaw or shoulders). You may include awareness of thoughts and emotions, without elaborating on a story or analyzing. I = Imagination: use your visualization skills to imagine your pelvic floor and the general area of attachments of the PFMs to the inside of the front, sides and back of the pelvis, the tailbone and sacrum. Visualize where the bladder and bowel are positioned and imagine them emptying and that the PFMs spanning across the pelvic floor are healthy and functioning optimally. R = Release & Relax: let go of any tension in the PFMs as best as you can. Releasing and relaxing these muscles can sometimes be difficult for a variety of reasons. Sometimes 'trying too hard' to relax and let go creates even more tension. Be patient  and compassionate towards yourself if you have trouble with this. Letting go often takes courage, trust, concentration and practise. B = Breathe: allow your natural breath pattern to emerge. Sometimes when we try to breathe, we create more tension that results in unnatural patterns that do not serve a relaxed state. As you quietly inhale, the belly will naturally protrude outwards or forward and the pelvic floor will descend. As you exhale, the belly and PFMs will return to their resting positions. During toileting, see if you can simply allow the quiet rhythm of the abdomino-pelvic diaphragmatic breath to happen on its own without trying to change it. A = Allow: this 'allowing' stage is a little more than just releasing and relaxing or allowing the breath to happen on its own. See if you can really give yourself permission to trust that your body knows what to do and when to do it. Perhaps you feel the need to push gently (do not strain) or you feel like you want to take a deep breath, sigh out loud, lean forward, or place your feet in a different position. The more refined your awareness skills are, the more you can trust what feels right, and not always what you think you should do. G = Gratitude: I think it is a healthy practice to not only be completely present and mindful when toileting, but to also honour this sophisticated and truly complex function that our body does for [place] on a daily basis without [place] even asking it to. So each time you complete your  toileting event, I invite you to send a little gratitude to your body and all its incredibly phenomenal parts to end your toilet meditation ! There is a FREE bonus feature of the full guided Toilet Meditation (with music and breathtaking cinematography) included along with the "Creating Pelvic Floor Health with PhysioYoga" practice sessions which consist of a combination of physical therapy based exercises and yoga methods targeted to optimize pelvic floor  health. I also have a free brief segment of the meditation on my YouTube channel.  2) Only wear the pedicure thing for ~ 20-30 min. At the end of the day while resting.  3) Hold for 5 deep breaths, repeat 2-3 times on each side.  4) Self Posterior Fourchette Stretching/Mobilization    1) Wash your hands and prop your body up so you can easily reach the vagina, bring hand-held mirror if desired.  2) Apply lubricant to the thumb and vaginal opening  3) Place thumb ~ 1/2 an inch into the vagina with the pad of the thumb pointed down and apply gentle pressure to the posterior fourchette.  4) Gently press, maintaining pressure down toward the anus. Make sure the pressure is not so great that your muscles tighten up and guard, just enough to create slight discomfort.  Do this for ~ 3 min. Or until you feel release, do this every 2-3 nights or until no more tenderness with pressure to decrease tightness and tenderness at the vaginal opening.

## 2019-06-19 ENCOUNTER — Ambulatory Visit: Payer: BC Managed Care – PPO

## 2019-06-24 ENCOUNTER — Ambulatory Visit: Payer: BC Managed Care – PPO | Attending: Internal Medicine

## 2019-06-24 DIAGNOSIS — Z23 Encounter for immunization: Secondary | ICD-10-CM

## 2019-06-24 NOTE — Progress Notes (Signed)
   Covid-19 Vaccination Clinic  Name:  Joyce Stewart    MRN: [id number] DOB: 04-02-1991  06/24/2019  Ms. Joyce Stewart was observed post Covid-19 immunization for 15 minutes without incident. She was provided with Vaccine Information Sheet and instruction to access the V-Safe system.   Ms. Joyce Stewart was instructed to call 911 with any severe reactions post vaccine: Marland Kitchen Difficulty breathing  . Swelling of face and throat  . A fast heartbeat  . A bad rash all over body  . Dizziness and weakness   Immunizations Administered    Name Date Dose VIS Date Route   Pfizer COVID-19 Vaccine 06/24/2019  3:12 PM 0.3 mL 03/14/2019 Intramuscular   Manufacturer: [employer], [employer]   Lot: [id number]   NDC: [address]-1

## 2019-06-26 ENCOUNTER — Ambulatory Visit: Payer: BC Managed Care – PPO

## 2019-06-26 DIAGNOSIS — R102 Pelvic and perineal pain: Secondary | ICD-10-CM | POA: Diagnosis not present

## 2019-06-26 NOTE — Therapy (Signed)
[place] [facility] MAIN [facility] SERVICES [address] [place], [place], [address] Phone: [phone]   Fax:  [phone]  Physical Therapy Treatment  The patient has been informed of current processes in place at Outpatient Rehab to protect patients from Covid-19 exposure including social distancing, schedule modifications, and new cleaning procedures. After discussing their particular risk with a therapist based on the patient's personal risk factors, the patient has decided to proceed with in-person therapy.   Patient Details  Name: Joyce Stewart MRN: [id number] Date of Birth: Dec 12, 1990 No data recorded  Encounter Date: 06/26/2019  PT End of Session - 06/27/19 1108    Visit Number  5    Number of Visits  10    Date for PT Re-Evaluation  07/25/19    Authorization - Visit Number  5    Authorization - Number of Visits  10    Progress Note Due on Visit  10    PT Start Time  1740    PT Stop Time  1835    PT Time Calculation (min)  55 min    Activity Tolerance  Patient tolerated treatment well;No increased pain    Behavior During Therapy  [facility] for tasks assessed/performed       No past medical history on file.  No past surgical history on file.  There were no vitals filed for this visit.    Pelvic Floor Physical Therapy Treatment Note  SCREENING  Changes in medications, allergies, or medical history?: got her second vaccine.    SUBJECTIVE  Patient reports: Toileting has been going better but she is still taking Mirilax every-other day. She still has some foot ache. Sometimes her low back feels tired. Did have one day where she actually had a sensation to have a BM. Did the self posterior fourchette release 2 times. Last night it felt like the pelvic tilts were getting a little bit easier.   Precautions:  none  Sexual activity/pain: Yes, with deeper thrusting. Hurts more if she has not had a BM.  Location of pain: lower abdomen/pelvis B and  taint. (vaginal deep) Current pain: 0/10  Max pain: 0/10 Least pain: 0/10 Nature of pain:Sharp shooting stabbing   Patient Goals: To be able to have a at least BM 4 times per week. And see if pelvic pain can be resolved.  OBJECTIVE  Changes in: Posture/Observations:  hyperlordosis  Range of Motion/Flexibilty:  Decreased mobility through T-L junction with referred pressure into abdomen an pelvis.  Strength/MMT:  LE MMT:   Pelvic Floor External Exam: Introitus Appears: WNL Skin integrity: WNL Palpation: no TTP Cough: not assessed Prolapse visible?: none Scar mobility: N/A  Internal Vaginal Exam: Strength (PERF): 5/5, 5 sec, 1 time Symmetry: L>R for TTP, greatest at L posterior PR/PC Palpation: TTP throughout all PFM Prolapse: none  TODAY: TTP to L anterior and posterior PR/PC, OI, coccygeus, and IC with some referred pain into lower abdomen with anterior PR/PC palpation.  Abdominal:  Decreased rib and abdominal excursion with Inhale/exhale, difficulty recruiting and sustaining TA contraction (improved following treatment by ~ 30%).  2 finger diastasis under umbilicus, 1 finger above. (from prior session)  Palpation:  Gait Analysis:  INTERVENTIONS THIS SESSION: Manual:  TP release to all internal PFM muscles on the L to decrease spasm and pain and allow for improved balance of musculature for improved function and decreased symptoms.  Self-care: Educated Pt. On the importance of continuing to stay regular to allow for returned BM sensation regularly  an dto continue with HEP.   Total time: 60 min.                            PT Short Term Goals - 05/16/19 0950      PT SHORT TERM GOAL #1   Title  Patient will demonstrate improved Sacral mobility and balance of musculature surrounding the pelvis to facilitate decreased PFM spasms and decrease pelvic pain.    Baseline  Anterior pelvic tilt, spasms surrounding L>R hip, LB tightness     Time  5    Period  Weeks    Status  New    Target Date  06/20/19      PT SHORT TERM GOAL #2   Title  Patient will demonstrate a coordinated contraction, relaxation, and bulge of the pelvic floor muscles to demonstrate functional recruitment and motion and allow for improved ease of BM.Marland Kitchen    Baseline  Pt. Hx. suggests poor PFM coordination and sensation leading to chronic constipation.    Time  5    Period  Weeks    Status  New    Target Date  06/20/19      PT SHORT TERM GOAL #3   Title  Patient will demonstrate HEP x1 in the clinic to demonstrate understanding and proper form to allow for further improvement.    Baseline  Pt. lacks knowledge of what therapeutic exercises will help decrease her Sx. , Pt. does high-impact exercise 3x/wk    Time  5    Period  Weeks    Status  New    Target Date  06/20/19        PT Long Term Goals - 05/16/19 0954      PT LONG TERM GOAL #1   Title  Patient will report no pain with intercourse to demonstrate improved functional ability.    Baseline  Pt. has pain with deeper thrusting    Time  10    Period  Weeks    Status  New    Target Date  07/25/19      PT LONG TERM GOAL #2   Title  Patient will report having BM's at least every-other day with consistency between [facility] stool scale 3-5 over the prior week to demonstrate decreased constipation.    Baseline  Pt. does not have sensation to have BM, going ~ 3x/week with use of Metamucil, Senna, and colace    Time  10    Period  Weeks    Status  New    Target Date  07/25/19      PT LONG TERM GOAL #3   Title  Pt. will improve in FOTO Bowel Cnst score by 20 points to demonstrate improved function.    Baseline  FOTO PFDI Pain: 4, Bowel Cnst: 41, PFDI Bowel: 4    Time  10    Period  Weeks    Status  New    Target Date  07/25/19            Plan - 06/27/19 1108    Clinical Impression Statement  Pt. Responded well to all interventions today, demonstrating decreased PFM spasm on the L PFM as  well as understanding and correct performance of all education and exercises provided today. They will continue to benefit from skilled physical therapy to work toward remaining goals and maximize function as well as decrease likelihood of symptom increase or recurrence.     PT  Next Visit Plan  Further internal TP release, TPDN to low back/TL junction to allow for improved sensation PRN, increase deep-core challenge    PT Home Exercise Plan  child's pose, frog pose, diaphragmatic breathing, toe stretch, hip-flexor stretch, Posterior pelvic tilt with TA/Oblique timing, toilet meditation, self posterior fourchette release, calf stretch on wall       Patient will benefit from skilled therapeutic intervention in order to improve the following deficits and impairments:     Visit Diagnosis: Pelvic pain  Other muscle spasm     Problem List Patient Active Problem List   Diagnosis Date Noted  . Constipation 05/15/2019  . Tendonitis of finger 05/15/2019  . Pelvic pain 05/15/2019   Willa Rough DPT, ATC Willa Rough 06/27/2019, 3:37 PM  [place] MAIN [facility] SERVICES [address] [place], [place], [address] Phone: [phone]   Fax:  [phone]  Name: Chastidy Ranker MRN: [id number] Date of Birth: 26-Feb-1991

## 2019-07-03 ENCOUNTER — Ambulatory Visit: Payer: BC Managed Care – PPO | Attending: Obstetrics and Gynecology

## 2019-07-03 ENCOUNTER — Other Ambulatory Visit: Payer: Self-pay

## 2019-07-03 DIAGNOSIS — R102 Pelvic and perineal pain: Secondary | ICD-10-CM | POA: Insufficient documentation

## 2019-07-03 DIAGNOSIS — M62838 Other muscle spasm: Secondary | ICD-10-CM | POA: Insufficient documentation

## 2019-07-03 NOTE — Therapy (Addendum)
[place] [facility] MAIN [facility] SERVICES [address] [place], [place], [address] Phone: [phone]   Fax:  [phone]  Physical Therapy Treatment  The patient has been informed of current processes in place at Outpatient Rehab to protect patients from Covid-19 exposure including social distancing, schedule modifications, and new cleaning procedures. After discussing their particular risk with a therapist based on the patient's personal risk factors, the patient has decided to proceed with in-person therapy.   Patient Details  Name: Joyce Stewart MRN: [id number] Date of Birth: 1990/10/25 No data recorded  Encounter Date: 07/03/2019  PT End of Session - 07/16/19 1452    Visit Number  6    Number of Visits  10    Date for PT Re-Evaluation  07/25/19    Authorization - Visit Number  6    Authorization - Number of Visits  10    Progress Note Due on Visit  10    PT Start Time  1740    PT Stop Time  1840    PT Time Calculation (min)  60 min    Activity Tolerance  Patient tolerated treatment well;No increased pain    Behavior During Therapy  [facility] for tasks assessed/performed       No past medical history on file.  No past surgical history on file.  There were no vitals filed for this visit.    Pelvic Floor Physical Therapy Treatment Note  SCREENING  Changes in medications, allergies, or medical history?: got her second vaccine.    SUBJECTIVE  Patient reports: This week was not as great, she did not do stretches. Did all of them at least 3 times each over the week. Was tired and cranky all week. Had 3/4 BMs over the week. Only had to strain one day/was rushing. Even her feet hurt a little less this week, only the left foot hurt. Back was pretty good except when she tried to do burpees one day, stopped the exercise and was ok the next day. Since she has been coming only one time has been painful for intercourse, cannot remember why that time was worse.  Felt the urge to have a BM this morning.  Precautions:  none  Sexual activity/pain: Yes, with deeper thrusting. Hurts more if she has not had a BM.  Location of pain: lower abdomen/pelvis B and taint. (vaginal deep) Current pain: 0/10  Max pain: 0/10 Least pain: 0/10 Nature of pain:Sharp shooting stabbing   Patient Goals: To be able to have a at least BM 4 times per week. And see if pelvic pain can be resolved.  OBJECTIVE  Changes in: Posture/Observations:  hyperlordosis  Range of Motion/Flexibilty:  Decreased mobility through T-L junction with referred pressure into abdomen an pelvis.  Strength/MMT:  LE MMT:   Pelvic Floor External Exam: (from 3/11) [Introitus Appears: WNL Skin integrity: WNL Palpation: no TTP Cough: not assessed Prolapse visible?: none Scar mobility: N/A  Internal Vaginal Exam: Strength (PERF): 5/5, 5 sec, 1 time Symmetry: L>R for TTP, greatest at L posterior PR/PC Palpation: TTP throughout all PFM Prolapse: none]  TODAY:   Abdominal:  Decreased rib and abdominal excursion with Inhale/exhale, difficulty recruiting and sustaining TA and Oblique contraction. Pt. Able to recruit in standing against wall. Significant space between back and wall in standing.    2 finger diastasis under umbilicus, 1 finger above. (from prior session)  Palpation:  Gait Analysis:  INTERVENTIONS THIS SESSION: Manual: Performed manual rib mobility to allow for improved  rib excursion/ROM and grade 3-4 PA mobs at the T/L junction to improve mobility of joint and surrounding connective tissue and decrease pressure on nerve roots for improved conductivity and function of down-stream tissues.   Therex: Educated on rib-flare, taught correction in hook-lying and standing against wall, emphasized returning to stretches/HEP to improve Sx.  NM re-ed:  PNF with hands and with green band around ribs to encourage use of diaphragm and obliques appropriately to off-load  T-L junction and decrease pressure on nerve roots that supply the Large intestine and Stomach.   Total time: 60 min.                           PT Short Term Goals - 05/16/19 0950      PT SHORT TERM GOAL #1   Title  Patient will demonstrate improved Sacral mobility and balance of musculature surrounding the pelvis to facilitate decreased PFM spasms and decrease pelvic pain.    Baseline  Anterior pelvic tilt, spasms surrounding L>R hip, LB tightness    Time  5    Period  Weeks    Status  New    Target Date  06/20/19      PT SHORT TERM GOAL #2   Title  Patient will demonstrate a coordinated contraction, relaxation, and bulge of the pelvic floor muscles to demonstrate functional recruitment and motion and allow for improved ease of BM.Marland Kitchen    Baseline  Pt. Hx. suggests poor PFM coordination and sensation leading to chronic constipation.    Time  5    Period  Weeks    Status  New    Target Date  06/20/19      PT SHORT TERM GOAL #3   Title  Patient will demonstrate HEP x1 in the clinic to demonstrate understanding and proper form to allow for further improvement.    Baseline  Pt. lacks knowledge of what therapeutic exercises will help decrease her Sx. , Pt. does high-impact exercise 3x/wk    Time  5    Period  Weeks    Status  New    Target Date  06/20/19        PT Long Term Goals - 05/16/19 0954      PT LONG TERM GOAL #1   Title  Patient will report no pain with intercourse to demonstrate improved functional ability.    Baseline  Pt. has pain with deeper thrusting    Time  10    Period  Weeks    Status  New    Target Date  07/25/19      PT LONG TERM GOAL #2   Title  Patient will report having BM's at least every-other day with consistency between [facility] stool scale 3-5 over the prior week to demonstrate decreased constipation.    Baseline  Pt. does not have sensation to have BM, going ~ 3x/week with use of Metamucil, Senna, and colace    Time  10     Period  Weeks    Status  New    Target Date  07/25/19      PT LONG TERM GOAL #3   Title  Pt. will improve in FOTO Bowel Cnst score by 20 points to demonstrate improved function.    Baseline  FOTO PFDI Pain: 4, Bowel Cnst: 41, PFDI Bowel: 4    Time  10    Period  Weeks    Status  New  Target Date  07/25/19            Plan - 07/16/19 1453    Clinical Impression Statement  Pt. Responded well to all interventions today, demonstrating improved mobility at the T/L junction and costo-vertebral joints/ rib mobility and ROM as well as understanding and correct performance of all education and exercises provided today. They will continue to benefit from skilled physical therapy to work toward remaining goals and maximize function as well as decrease likelihood of symptom increase or recurrence.     PT Next Visit Plan  Further internal TP release, TPDN to low back/TL junction to allow for improved sensation PRN, increase deep-core challenge    PT Home Exercise Plan  child's pose, frog pose, diaphragmatic breathing, toe stretch, hip-flexor stretch, Posterior pelvic tilt with TA/Oblique timing, toilet meditation, self posterior fourchette release, calf stretch on wall    Consulted and Agree with Plan of Care  Patient       Patient will benefit from skilled therapeutic intervention in order to improve the following deficits and impairments:     Visit Diagnosis: Pelvic pain  Other muscle spasm     Problem List Patient Active Problem List   Diagnosis Date Noted  . Constipation 05/15/2019  . Tendonitis of finger 05/15/2019  . Pelvic pain 05/15/2019   Willa Rough DPT, ATC Willa Rough 07/16/2019, 3:05 PM  [place] MAIN [facility] SERVICES [address] [place], [place], [address] Phone: [phone]   Fax:  [phone]  Name: Oliwia Berzins MRN: [id number] Date of Birth: 23-Jan-1991

## 2019-07-07 ENCOUNTER — Ambulatory Visit
Admission: RE | Admit: 2019-07-07 | Discharge: 2019-07-07 | Disposition: A | Discharge status: Home / Self Care | Payer: BC Managed Care – PPO | Source: Ambulatory Visit | Attending: Obstetrics and Gynecology | Admitting: Obstetrics and Gynecology

## 2019-07-07 DIAGNOSIS — N6320 Unspecified lump in the left breast, unspecified quadrant: Secondary | ICD-10-CM | POA: Diagnosis present

## 2019-07-10 ENCOUNTER — Ambulatory Visit: Payer: BC Managed Care – PPO

## 2019-07-10 ENCOUNTER — Other Ambulatory Visit: Payer: Self-pay

## 2019-07-10 DIAGNOSIS — M62838 Other muscle spasm: Secondary | ICD-10-CM

## 2019-07-10 DIAGNOSIS — R102 Pelvic and perineal pain: Secondary | ICD-10-CM

## 2019-07-10 NOTE — Patient Instructions (Addendum)
  When seated, you want to maintain pelvic neutral with the shoulders gently down and back and ears in line with your shoulders. A lumbar roll such as the one below or a home-made towel-roll can be used for this purpose. Even Olympic athletes can only maintain proper seated posture for about 10 minutes without support!    Pictured: The Original [employer] Lumbar Roll  Mini-Marches    Exhale, drawing the lower tummy (TA) in toward the back bone and hold contraction while you lift one foot ~ 2 inches off the mat, then the other foot before relaxing and resetting. (start with just 1 foot at a time)Try to keep your hips from rocking, using your hands to sense whether they are staying even as pictured.      Perform _10__ repetitions for _3__ sets. Do this _1-2_ times per day.

## 2019-07-10 NOTE — Therapy (Addendum)
[place] [facility] MAIN [facility] SERVICES [address] [place], [place], [address] Phone: [phone]   Fax:  [phone]  Physical Therapy Treatment  The patient has been informed of current processes in place at Outpatient Rehab to protect patients from Covid-19 exposure including social distancing, schedule modifications, and new cleaning procedures. After discussing their particular risk with a therapist based on the patient's personal risk factors, the patient has decided to proceed with in-person therapy.   Patient Details  Name: Joyce Stewart MRN: [id number] Date of Birth: 06/27/1990 No data recorded  Encounter Date: 07/10/2019  PT End of Session - 07/16/19 1607    Visit Number  7    Number of Visits  1830    Date for PT Re-Evaluation  07/25/19    Authorization - Visit Number  7    Authorization - Number of Visits  10    Progress Note Due on Visit  10    PT Start Time  1730    PT Stop Time  1830    PT Time Calculation (min)  60 min    Activity Tolerance  Patient tolerated treatment well;No increased pain    Behavior During Therapy  [facility] for tasks assessed/performed       No past medical history on file.  No past surgical history on file.  There were no vitals filed for this visit.    Pelvic Floor Physical Therapy Treatment Note  SCREENING  Changes in medications, allergies, or medical history?: got her second vaccine.    SUBJECTIVE  Patient reports: Low back doesn't hurt much, R foot feels better but L foot hurts. Went on Saturday sort of went on Sunday, Monday and Tuesday were good. Was in a better mood this week. Did the stretches and massage every-other night, still taking mirilax every-other day. Felt "something" on Monday and Tuesday when she needed to have a BM. Sunday she had to try to go. The "stretch with the band" has not been going very well, feels really awkward.  Precautions:  none  Sexual activity/pain: Yes, with deeper  thrusting. Hurts more if she has not had a BM.  Location of pain: lower abdomen/pelvis B and taint. (vaginal deep) Current pain: 0/10  Max pain: 0/10 Least pain: 0/10 Nature of pain:Sharp shooting stabbing   Patient Goals: To be able to have a at least BM 4 times per week. And see if pelvic pain can be resolved.  OBJECTIVE  Changes in: Posture/Observations:  hyperlordosis  Range of Motion/Flexibilty:  Decreased mobility through T-L junction with referred pressure into abdomen an pelvis.  Strength/MMT:  LE MMT:   Pelvic Floor External Exam: Introitus Appears: WNL Skin integrity: WNL Palpation: no TTP Cough: not assessed Prolapse visible?: none Scar mobility: N/A  Internal Vaginal Exam: Strength (PERF): 5/5, 5 sec, 1 time Symmetry: L>R for TTP, greatest at L posterior PR/PC Palpation: TTP throughout all PFM Prolapse: none  TODAY: TTP to L anterior and posterior PR/PC, OI, coccygeus, and IC with some referred pain into lower abdomen with anterior PR/PC palpation.  Abdominal:  Decreased rib and abdominal excursion with Inhale/exhale, difficulty recruiting and sustaining TA contraction (improved following treatment by ~ 30%).  2 finger diastasis under umbilicus, 1 finger above. (from prior session)  Palpation:  Gait Analysis:  INTERVENTIONS THIS SESSION: Manual:  Performed TP release to diaphragm to allow for increased ROM and decreased resistance to breathing as well as to encourage improved coordination to use diaphragm as a breathing rather than  a postural muscle.  Therex: Performed further manual PNF to ribs following release of diaphragm and continued using verbal, tactile, and visual cues to improve deep-core coordination, incorporating both TA and Obliques and working all the way up to mini-marches.   Self-care: Educated on seated posture/ discussed classroom options to prevent over-use of hip-flexors and lumbar extensors from sitting in under-sized  chairs at work.   Total time: 60 min.                           PT Short Term Goals - 05/16/19 0950      PT SHORT TERM GOAL #1   Title  Patient will demonstrate improved Sacral mobility and balance of musculature surrounding the pelvis to facilitate decreased PFM spasms and decrease pelvic pain.    Baseline  Anterior pelvic tilt, spasms surrounding L>R hip, LB tightness    Time  5    Period  Weeks    Status  New    Target Date  06/20/19      PT SHORT TERM GOAL #2   Title  Patient will demonstrate a coordinated contraction, relaxation, and bulge of the pelvic floor muscles to demonstrate functional recruitment and motion and allow for improved ease of BM.Marland Kitchen    Baseline  Pt. Hx. suggests poor PFM coordination and sensation leading to chronic constipation.    Time  5    Period  Weeks    Status  New    Target Date  06/20/19      PT SHORT TERM GOAL #3   Title  Patient will demonstrate HEP x1 in the clinic to demonstrate understanding and proper form to allow for further improvement.    Baseline  Pt. lacks knowledge of what therapeutic exercises will help decrease her Sx. , Pt. does high-impact exercise 3x/wk    Time  5    Period  Weeks    Status  New    Target Date  06/20/19        PT Long Term Goals - 05/16/19 0954      PT LONG TERM GOAL #1   Title  Patient will report no pain with intercourse to demonstrate improved functional ability.    Baseline  Pt. has pain with deeper thrusting    Time  10    Period  Weeks    Status  New    Target Date  07/25/19      PT LONG TERM GOAL #2   Title  Patient will report having BM's at least every-other day with consistency between [facility] stool scale 3-5 over the prior week to demonstrate decreased constipation.    Baseline  Pt. does not have sensation to have BM, going ~ 3x/week with use of Metamucil, Senna, and colace    Time  10    Period  Weeks    Status  New    Target Date  07/25/19      PT LONG TERM GOAL  #3   Title  Pt. will improve in FOTO Bowel Cnst score by 20 points to demonstrate improved function.    Baseline  FOTO PFDI Pain: 4, Bowel Cnst: 41, PFDI Bowel: 4    Time  10    Period  Weeks    Status  New    Target Date  07/25/19            Plan - 07/16/19 1607    Clinical Impression Statement  Pt. Responded  well to all interventions today, demonstrating improved deep-core coordination, decreased spasm, as well as understanding and correct performance of all education and exercises provided today. They will continue to benefit from skilled physical therapy to work toward remaining goals and maximize function as well as decrease likelihood of symptom increase or recurrence.     PT Next Visit Plan  Further internal TP release, TPDN to low back/TL junction to allow for improved sensation PRN, increase deep-core challenge    PT Home Exercise Plan  child's pose, frog pose, diaphragmatic breathing, toe stretch, hip-flexor stretch, Posterior pelvic tilt with TA/Oblique timing, toilet meditation, self posterior fourchette release, calf stretch on wall, obliques/TA recruitment with band, mini-marches    Consulted and Agree with Plan of Care  Patient       Patient will benefit from skilled therapeutic intervention in order to improve the following deficits and impairments:     Visit Diagnosis: Pelvic pain  Other muscle spasm     Problem List Patient Active Problem List   Diagnosis Date Noted  . Constipation 05/15/2019  . Tendonitis of finger 05/15/2019  . Pelvic pain 05/15/2019   Willa Rough DPT, ATC Willa Rough 07/16/2019, 4:10 PM  [place] MAIN [facility] SERVICES [address] [place], [place], [address] Phone: [phone]   Fax:  [phone]  Name: Ardine Iacovelli MRN: [id number] Date of Birth: [birth year]

## 2019-07-15 ENCOUNTER — Other Ambulatory Visit: Payer: BC Managed Care – PPO

## 2019-07-16 ENCOUNTER — Other Ambulatory Visit: Payer: Self-pay | Admitting: Obstetrics and Gynecology

## 2019-07-16 DIAGNOSIS — N6320 Unspecified lump in the left breast, unspecified quadrant: Secondary | ICD-10-CM

## 2019-07-17 ENCOUNTER — Ambulatory Visit: Payer: BC Managed Care – PPO

## 2019-07-17 ENCOUNTER — Other Ambulatory Visit: Payer: Self-pay

## 2019-07-17 DIAGNOSIS — M62838 Other muscle spasm: Secondary | ICD-10-CM

## 2019-07-17 DIAGNOSIS — R102 Pelvic and perineal pain: Secondary | ICD-10-CM

## 2019-07-17 NOTE — Therapy (Signed)
[facility] MAIN [facility] SERVICES [address] [place], [place], [address] Phone: [phone]   Fax:  [phone]  Physical Therapy Treatment  The patient has been informed of current processes in place at Outpatient Rehab to protect patients from Covid-19 exposure including social distancing, schedule modifications, and new cleaning procedures. After discussing their particular risk with a therapist based on the patient's personal risk factors, the patient has decided to proceed with in-person therapy.   Patient Details  Name: Denia Mcvicar MRN: [id number] Date of Birth: 26-Mar-1991 No data recorded  Encounter Date: 07/17/2019  PT End of Session - 07/18/19 0933    Visit Number  8    Number of Visits  10    Date for PT Re-Evaluation  07/25/19    Authorization - Visit Number  8    Authorization - Number of Visits  10    Progress Note Due on Visit  10    PT Start Time  1711    PT Stop Time  1811    PT Time Calculation (min)  60 min    Activity Tolerance  Patient tolerated treatment well;No increased pain    Behavior During Therapy  [facility] for tasks assessed/performed       No past medical history on file.  No past surgical history on file.  There were no vitals filed for this visit.     Pelvic Floor Physical Therapy Treatment Note  SCREENING  Changes in medications, allergies, or medical history?: got her second vaccine.    SUBJECTIVE  Patient reports: Feels like she has plateaued a little bit, had a BM 3 days in a row. Used the lumbar support in the car and felt that she had a little upper back discomfort. Still gets a "zap" in the vaginal area occasionally but not longer than ~ 3-5 seconds and doesn't make her stop what she is doing anymore. Still taking mirilax every-other day and doing stretches.  Precautions:  none  Sexual activity/pain: Yes, with deeper thrusting. Hurts more if she has not had a BM.  Location of pain: lower  abdomen/pelvis B and taint. (vaginal deep) Current pain: 0/10  Max pain: 5/10 Least pain: 0/10 Nature of pain:Zap in the lower abdomen/pelvis. Sharp shooting stabbing   Patient Goals: To be able to have a at least BM 4 times per week. And see if pelvic pain can be resolved.  OBJECTIVE  Changes in: Posture/Observations:  hyperlordosis  Range of Motion/Flexibilty:  Decreased mobility through T-L junction with referred pressure into abdomen an pelvis.  Strength/MMT:  LE MMT:   Pelvic Floor External Exam: Introitus Appears: WNL Skin integrity: WNL Palpation: no TTP Cough: not assessed Prolapse visible?: none Scar mobility: N/A  Internal Vaginal Exam: Strength (PERF): 5/5, 5 sec, 1 time Symmetry: L>R for TTP, greatest at L posterior PR/PC Palpation: TTP throughout all PFM Prolapse: none  TODAY: TTP near nerve bundle between superficial and deep PFM, and mild TTP on R through PR/PC both anteriorly and posteriorly  Abdominal:  Decreased rib and abdominal excursion with Inhale/exhale, difficulty recruiting and sustaining TA contraction (improved following treatment by ~ 30%).  2 finger diastasis under umbilicus, 1 finger above. (from prior session)  Palpation:  Gait Analysis:  INTERVENTIONS THIS SESSION: Manual:  Re-assessed PFM for TTP and found little on the right, significant TTP at nerve bundle between superficial and deep PFM, and mild TTP on R through PR/PC both anteriorly and posteriorly which eased quickly with treatment to decrease  spasm and pain and allow for improved balance of musculature for improved function and decreased symptoms..  Therex: Reviewed mini-marches and instructed to start doing both feet within one breath and try to increase the speed at which she can recruit deep-core to allow for up to 2 "sets" in one breath or holding core while taking small breaths. Briefly explained how this will integrate into more functional motions like squats,  mountain climbers etc. Attempted self-thoracic  Mobilization with foam roller but Pt. Unable to feel desired sensation.  Self-care: Discussed progress thus far and how to determine POC following re-assessment next week. Educated on potential of self-release tool if muscles do not maintain relaxation following today's visit.  Total time: 60 min.                           PT Short Term Goals - 05/16/19 0950      PT SHORT TERM GOAL #1   Title  Patient will demonstrate improved Sacral mobility and balance of musculature surrounding the pelvis to facilitate decreased PFM spasms and decrease pelvic pain.    Baseline  Anterior pelvic tilt, spasms surrounding L>R hip, LB tightness    Time  5    Period  Weeks    Status  New    Target Date  06/20/19      PT SHORT TERM GOAL #2   Title  Patient will demonstrate a coordinated contraction, relaxation, and bulge of the pelvic floor muscles to demonstrate functional recruitment and motion and allow for improved ease of BM.Marland Kitchen    Baseline  Pt. Hx. suggests poor PFM coordination and sensation leading to chronic constipation.    Time  5    Period  Weeks    Status  New    Target Date  06/20/19      PT SHORT TERM GOAL #3   Title  Patient will demonstrate HEP x1 in the clinic to demonstrate understanding and proper form to allow for further improvement.    Baseline  Pt. lacks knowledge of what therapeutic exercises will help decrease her Sx. , Pt. does high-impact exercise 3x/wk    Time  5    Period  Weeks    Status  New    Target Date  06/20/19        PT Long Term Goals - 05/16/19 0954      PT LONG TERM GOAL #1   Title  Patient will report no pain with intercourse to demonstrate improved functional ability.    Baseline  Pt. has pain with deeper thrusting    Time  10    Period  Weeks    Status  New    Target Date  07/25/19      PT LONG TERM GOAL #2   Title  Patient will report having BM's at least every-other day with  consistency between [facility] stool scale 3-5 over the prior week to demonstrate decreased constipation.    Baseline  Pt. does not have sensation to have BM, going ~ 3x/week with use of Metamucil, Senna, and colace    Time  10    Period  Weeks    Status  New    Target Date  07/25/19      PT LONG TERM GOAL #3   Title  Pt. will improve in FOTO Bowel Cnst score by 20 points to demonstrate improved function.    Baseline  FOTO PFDI Pain: 4, Bowel Cnst: 41,  PFDI Bowel: 4    Time  10    Period  Weeks    Status  New    Target Date  07/25/19            Plan - 07/18/19 0933    Clinical Impression Statement  Pt. Responded well to all interventions today, demonstrating improved PFM relaxation, deep-core recruitment and strength, as well as understanding and correct performance of all education and exercises provided today. They will continue to benefit from skilled physical therapy to work toward remaining goals and maximize function as well as decrease likelihood of symptom increase or recurrence.     PT Next Visit Plan  re-assess internally, next visit Update goals and do 3 times at 1x/monthTPDN to back/TL junction PRN to allow for improved sensation PRN, increase deep-core challenge    PT Home Exercise Plan  child's pose, frog pose, diaphragmatic breathing, toe stretch, hip-flexor stretch, Posterior pelvic tilt with TA/Oblique timing, toilet meditation, self posterior fourchette release, calf stretch on wall, obliques/TA recruitment with band, mini-marches    Consulted and Agree with Plan of Care  Patient       Patient will benefit from skilled therapeutic intervention in order to improve the following deficits and impairments:     Visit Diagnosis: Pelvic pain  Other muscle spasm     Problem List Patient Active Problem List   Diagnosis Date Noted  . Constipation 05/15/2019  . Tendonitis of finger 05/15/2019  . Pelvic pain 05/15/2019   Cleophus Molt DPT, ATC Cleophus Molt 07/18/2019, 9:36 AM  [facility] MAIN [facility] SERVICES [address] [place], [place], [address] Phone: [phone]   Fax:  [phone]  Name: Katlen Seyer MRN: [id number] Date of Birth: [birth year]

## 2019-07-17 NOTE — Patient Instructions (Signed)
   Start doing two steps in one breath, working up to four feet in one breath during mini-marches.

## 2019-08-06 ENCOUNTER — Ambulatory Visit: Payer: BC Managed Care – PPO | Attending: Obstetrics and Gynecology

## 2019-08-06 DIAGNOSIS — R102 Pelvic and perineal pain: Secondary | ICD-10-CM | POA: Diagnosis present

## 2019-08-06 DIAGNOSIS — M62838 Other muscle spasm: Secondary | ICD-10-CM | POA: Diagnosis present

## 2019-08-06 NOTE — Therapy (Addendum)
[place] MAIN [facility] SERVICES [address] [place], [place], [address] Phone: [phone]   Fax:  [phone]  Physical Therapy Treatment  Patient Details  Name: Joyce Stewart MRN: [id number] Date of Birth: Jan 17, 1991 No data recorded  Encounter Date: 08/06/2019  PT End of Session - 08/11/19 0853    Visit Number  9    Number of Visits  10    Date for PT Re-Evaluation  07/25/19    Authorization - Visit Number  9    Authorization - Number of Visits  10    Progress Note Due on Visit  10    PT Start Time  8341    PT Stop Time  1836    PT Time Calculation (min)  60 min    Activity Tolerance  Patient tolerated treatment well;No increased pain    Behavior During Therapy  [facility] for tasks assessed/performed       No past medical history on file.  No past surgical history on file.  There were no vitals filed for this visit.    Pelvic Floor Physical Therapy Treatment Note  SCREENING  Changes in medications, allergies, or medical history?: none  SUBJECTIVE  Patient reports: "bathrooming has been pretty good" she is taking 1/2 dose every-other day. Has been going ~ 4 times per week. Her low back on L>R is bothering her since Monday. Has not been doing as much exercise due to the pain. Went out on the boat on Sunday but was not rough water.  Precautions:  none  Sexual activity/pain: Yes, with deeper thrusting. Hurts more if she has not had a BM.  Location of pain:Low back on L>R  Current pain: 3/10  Max pain: 6/10 Least pain: 0/10 Nature of pain:tight and stingy   Patient Goals: To be able to have a at least BM 4 times per week. And see if pelvic pain can be resolved.  OBJECTIVE  Changes in: Posture/Observations:  hyperlordosis  Range of Motion/Flexibilty:  Decreased mobility through T-L junction with referred pressure into abdomen an pelvis.  Strength/MMT:  LE MMT:   Pelvic Floor (3-11) External Exam: Introitus  Appears: WNL Skin integrity: WNL Palpation: no TTP Cough: not assessed Prolapse visible?: none Scar mobility: N/A  Internal Vaginal Exam: Strength (PERF): 5/5, 5 sec, 1 time Symmetry: L>R for TTP, greatest at L posterior PR/PC Palpation: TTP throughout all PFM Prolapse: none  TODAY: TTP near L3-5 lumbar multifidus B.  Abdominal:  Decreased rib and abdominal excursion with Inhale/exhale, difficulty recruiting and sustaining TA contraction (improved following treatment by ~ 30%).  2 finger diastasis under umbilicus, 1 finger above. (from prior session)  Palpation:  Gait Analysis:  INTERVENTIONS THIS SESSION: Manual:  Performed TP release and STM to B lumbar paraspinals to decrease spasm and pain and allow for improved balance of musculature for improved function and decreased symptoms.  Dry-needle: Performed TPDN with 4 .30x46m needles and standard approach as described below to decrease spasm and pain and allow for improved balance of musculature for improved function and decreased symptoms.  Therex: Educated on how to perform self TP release with a tennis ball to erector spinae for further spasm relief.  Self-care: Discussed goals and determined POC to achieve remaining goals.  Total time: 60 min.                    Trigger Point Dry Needling - 08/11/19 0001    Consent Given?  Yes    Education  Handout Provided  No    Muscles Treated Back/Hip  Erector spinae;Lumbar multifidi;Thoracic multifidi    Electrical Stimulation Performed with Dry Needling  Yes    Erector spinae Response  Twitch response elicited;Palpable increased muscle length    Lumbar multifidi Response  Twitch response elicited;Palpable increased muscle length    Thoracic multifidi response  Twitch response elicited;Palpable increased muscle length             PT Short Term Goals - 08/06/19 1803      PT SHORT TERM GOAL #1   Title  Patient will demonstrate improved Sacral mobility  and balance of musculature surrounding the pelvis to facilitate decreased PFM spasms and decrease pelvic pain.    Baseline  Anterior pelvic tilt, spasms surrounding L>R hip, LB tightness    Time  5    Period  Weeks    Status  Achieved    Target Date  06/20/19      PT SHORT TERM GOAL #2   Title  Patient will demonstrate a coordinated contraction, relaxation, and bulge of the pelvic floor muscles to demonstrate functional recruitment and motion and allow for improved ease of BM..    Baseline  Pt. Hx. suggests poor PFM coordination and sensation leading to chronic constipation.    Time  5    Period  Weeks    Status  Achieved    Target Date  06/20/19      PT SHORT TERM GOAL #3   Title  Patient will demonstrate HEP x1 in the clinic to demonstrate understanding and proper form to allow for further improvement.    Baseline  Pt. lacks knowledge of what therapeutic exercises will help decrease her Sx. , Pt. does high-impact exercise 3x/wk    Time  5    Period  Weeks    Status  Achieved    Target Date  06/20/19        PT Long Term Goals - 08/06/19 1804      PT LONG TERM GOAL #1   Title  Patient will report no pain with intercourse to demonstrate improved functional ability.    Baseline  Pt. has pain with deeper thrusting    Time  10    Period  Weeks    Status  Achieved    Target Date  07/25/19      PT LONG TERM GOAL #2   Title  Patient will report having BM's at least every-other day with consistency between [facility] stool scale 3-5 over the prior week to demonstrate decreased constipation.    Baseline  Pt. does not have sensation to have BM, going ~ 3x/week with use of Metamucil, Senna, and colace    Time  10    Period  Weeks    Status  Achieved    Target Date  07/25/19      PT LONG TERM GOAL #3   Title  Pt. will improve in FOTO Bowel Cnst score by 20 points to demonstrate improved function.    Baseline  FOTO PFDI Pain: 4, Bowel Cnst: 41, PFDI Bowel: 4 As of 5/5: Improved by 4  points in pain (maximal score), improved by 6 points in constipation, maintained score for PFDI bowel (0 47 4)    Time  10    Period  Weeks    Status  On-going    Target Date  07/25/19            Plan - 08/11/19 0853    Clinical   Impression Statement  Pt. 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 We will continue for 6 more weeks on an every-other-week basis to allow for goal attainment and maintenence of symptom reduction.    Examination-Activity Limitations  Toileting    Examination-Participation Restrictions  Interpersonal Relationship    Stability/Clinical Decision Making  Stable/Uncomplicated    Rehab Potential  Good    PT Frequency  Biweekly    PT Duration  6 weeks    PT Treatment/Interventions  ADLs/Self Care Home Management;Biofeedback;Ultrasound;Traction;Moist Heat;Electrical Stimulation;Gait training;Functional mobility training;Neuromuscular re-education;Therapeutic exercise;Therapeutic activities;Patient/family education;Manual techniques;Passive range of motion;Dry needling;Taping;Spinal Manipulations;Joint Manipulations    PT Next Visit Plan  re-assess internally, ask about change since TPDN with E-stim, increase deep-core challenge and assess running, teach how to exercise with use of deep core.    PT Home Exercise Plan  child's pose, frog pose, diaphragmatic breathing, toe stretch, hip-flexor stretch, Posterior pelvic tilt with TA/Oblique timing, toilet meditation, self posterior fourchette release, calf stretch on wall, obliques/TA recruitment with band, mini-marches    Consulted and Agree with Plan  of Care  Patient       Patient will benefit from skilled therapeutic intervention in order to improve the following deficits and impairments:  Hypomobility, Improper body mechanics, Decreased activity tolerance, Decreased coordination, Increased fascial restricitons, Impaired flexibility, Postural dysfunction, Pain  Visit Diagnosis: Pelvic pain  Other muscle spasm     Problem List Patient Active Problem List   Diagnosis Date Noted  . Constipation 05/15/2019  . Tendonitis of finger 05/15/2019  . Pelvic pain 05/15/2019   Keeli T. Gailes DPT, ATC Keeli T Gailes 08/11/2019, 9:14 AM   [facility] MAIN REHAB SERVICES [address] [place], , [address] Phone: [phone]   Fax:  [phone]  Name: Joyce Stewart MRN: [id number] Date of Birth: 03/13/1991   

## 2019-08-06 NOTE — Patient Instructions (Signed)
    This is The QL muscle  To perform release on this muscle, start by getting into this position by bridging the hips up and then slowly lowering your back, then your butt down to lengthen the low back then put the ball under you where you feel the tender spot and roll to the same side slightly to add pressure as needed. Hold still and take deep breaths until the pain is at least 50% less or, ideally, just pressure.   This is your piriformis    To release this muscle start in this position with your ankle crossed over the opposite knee. Place the tennis ball under your buttock where the tender spot is and then slightly roll your weight to the same side to put just enough pressure that it is uncomfortable. Hold and take deep breaths until the pain is at least 50% less or, ideally ,just pressure.   This is your Gluteus Medius and Minimus  To perform release on this muscle, start by getting into this position by bridging the hips up and then slowly lowering your back, then your butt down to lengthen the low back then put the ball under you where you feel the tender spot and roll to the same side slightly to add pressure as needed. Hold still and take deep breaths until the pain is at least 50% less or, ideally, just pressure.   These are your deep hip-flexor muscles. They are easiest to reach where they come together at the hip.   To perform release on this muscle, start by getting into this position by laying on your stomach then put the ball under you where you feel the tender spot and bring the opposite knee up/out to the side to add pressure as needed. Hold still and take deep breaths until the pain is at least 50% less or, ideally ,just pressure.   

## 2019-08-11 NOTE — Addendum Note (Signed)
Addended by: Flora Lipps T on: 08/11/2019 09:20 AM   Modules accepted: Orders

## 2019-08-28 ENCOUNTER — Ambulatory Visit: Payer: BC Managed Care – PPO

## 2019-08-28 ENCOUNTER — Other Ambulatory Visit: Payer: Self-pay

## 2019-08-28 DIAGNOSIS — R102 Pelvic and perineal pain: Secondary | ICD-10-CM | POA: Diagnosis not present

## 2019-08-28 DIAGNOSIS — M62838 Other muscle spasm: Secondary | ICD-10-CM

## 2019-08-28 NOTE — Therapy (Signed)
[place] MAIN [facility] SERVICES [address] [place], [place], [address] Phone: [phone]   Fax:  [phone]  Physical Therapy Treatment  The patient has been informed of current processes in place at Outpatient Rehab to protect patients from Covid-19 exposure including social distancing, schedule modifications, and new cleaning procedures. After discussing their particular risk with a therapist based on the patient's personal risk factors, the patient has decided to proceed with in-person therapy.  Patient Details  Name: Joyce Stewart MRN: [id number] Date of Birth: March 01, 1991 No data recorded  Encounter Date: 08/28/2019  PT End of Session - 08/28/19 1735    Visit Number  10    Number of Visits  12    Date for PT Re-Evaluation  07/25/19    Authorization - Visit Number  1    Authorization - Number of Visits  3    Progress Note Due on Visit  12    PT Start Time  6962    PT Stop Time  1630    PT Time Calculation (min)  55 min    Activity Tolerance  Patient tolerated treatment well;No increased pain    Behavior During Therapy  [facility] for tasks assessed/performed       No past medical history on file.  No past surgical history on file.  There were no vitals filed for this visit.    Pelvic Floor Physical Therapy Treatment Note  SCREENING  Changes in medications, allergies, or medical history?: none  SUBJECTIVE  Patient reports: The first week was pretty good since last visit but after ~ the 15th it got worse, she had some pebbly/ constipation poops 2 days and then has gone 2 times more. Was on her period and had bad behavior/stress with students and tried doing "bent-leg jack-knifes" and has been doing stretches, decreased to every 2 days with mirilax but thinks she may need to go back to every-other day. Got more of the "jolts that remind you that your alive" this week.  Tennis ball "massage" did not work out well. Found a lot of tenderness  at the R side of her coccyx/sarcum.   Precautions:  none  Sexual activity/pain: Yes, with deeper thrusting. Hurts more if she has not had a BM.  Location of pain:Low back on L>R  Current pain: 3/10  Max pain: 6/10 Least pain: 0/10 Nature of pain:tight and stingy   Patient Goals: To be able to have a at least BM 4 times per week. And see if pelvic pain can be resolved.  OBJECTIVE  Changes in: Posture/Observations:  hyperlordosis  Range of Motion/Flexibilty:  Decreased mobility through T-L junction with referred pressure into abdomen an pelvis. Decreased sacral and lumbar mobility especially at and above L-S joint.  Strength/MMT:  LE MMT:   Pelvic Floor (3-11) External Exam: Introitus Appears: WNL Skin integrity: WNL Palpation: no TTP Cough: not assessed Prolapse visible?: none Scar mobility: N/A  Internal Vaginal Exam: Strength (PERF): 5/5, 5 sec, 1 time Symmetry: L>R for TTP, greatest at L posterior PR/PC Palpation: TTP throughout all PFM Prolapse: none  TODAY: TTP to R piriformis, and multifidus and erector spinae at ~ T12  Abdominal:  Decreased rib and abdominal excursion with Inhale/exhale, difficulty recruiting and sustaining TA contraction (improved following treatment by ~ 30%).  2 finger diastasis under umbilicus, 1 finger above. (from prior session)  Palpation: TTP to multifidus and paraspinals on the R near the T-L junction.   Gait Analysis:  INTERVENTIONS THIS SESSION:  Manual:  Performed TP release and R piriformis, and multifidus and erector spinae at ~ T12 followed by grade 3-4 PA mobs to sacrum, lumbar and lower thoracic spine to decrease spasm and pain and allow for improved balance of musculature for improved function and decreased symptoms and to improve mobility of joint and surrounding connective tissue and decrease pressure on nerve roots for improved conductivity and function of down-stream tissues.   Dry-needle: Performed TPDN  with a .30x10mm needle and standard approach as described below to decrease spasm and pain and allow for improved balance of musculature for improved function and decreased symptoms.  Therex: assessed Pt. Performance of "jack-knife" exercise and gave recommendation on how to use deep-core engagement with hands under her tailbone and keeping the back flat to minimize separation of the diastasis.   Total time: 60 min.                     Trigger Point Dry Needling - 08/28/19 0001    Consent Given?  Yes    Education Handout Provided  No    Muscles Treated Back/Hip  Erector spinae;Thoracic multifidi    Lumbar multifidi Response  Twitch response elicited;Palpable increased muscle length    Thoracic multifidi response  Twitch response elicited;Palpable increased muscle length             PT Short Term Goals - 08/06/19 1803      PT SHORT TERM GOAL #1   Title  Patient will demonstrate improved Sacral mobility and balance of musculature surrounding the pelvis to facilitate decreased PFM spasms and decrease pelvic pain.    Baseline  Anterior pelvic tilt, spasms surrounding L>R hip, LB tightness    Time  5    Period  Weeks    Status  Achieved    Target Date  06/20/19      PT SHORT TERM GOAL #2   Title  Patient will demonstrate a coordinated contraction, relaxation, and bulge of the pelvic floor muscles to demonstrate functional recruitment and motion and allow for improved ease of BM.Marland Kitchen    Baseline  Pt. Hx. suggests poor PFM coordination and sensation leading to chronic constipation.    Time  5    Period  Weeks    Status  Achieved    Target Date  06/20/19      PT SHORT TERM GOAL #3   Title  Patient will demonstrate HEP x1 in the clinic to demonstrate understanding and proper form to allow for further improvement.    Baseline  Pt. lacks knowledge of what therapeutic exercises will help decrease her Sx. , Pt. does high-impact exercise 3x/wk    Time  5    Period  Weeks     Status  Achieved    Target Date  06/20/19        PT Long Term Goals - 08/06/19 1804      PT LONG TERM GOAL #1   Title  Patient will report no pain with intercourse to demonstrate improved functional ability.    Baseline  Pt. has pain with deeper thrusting    Time  10    Period  Weeks    Status  Achieved    Target Date  07/25/19      PT LONG TERM GOAL #2   Title  Patient will report having BM's at least every-other day with consistency between [facility] stool scale 3-5 over the prior week to demonstrate decreased constipation.    Baseline  Pt. does not have sensation to have BM, going ~ 3x/week with use of Metamucil, Senna, and colace    Time  10    Period  Weeks    Status  Achieved    Target Date  07/25/19      PT LONG TERM GOAL #3   Title  Pt. will improve in FOTO Bowel Cnst score by 20 points to demonstrate improved function.    Baseline  FOTO PFDI Pain: 4, Bowel Cnst: 41, PFDI Bowel: 4 As of 5/5: Improved by 4 points in pain (maximal score), improved by 6 points in constipation, maintained score for PFDI bowel (0 47 4)    Time  10    Period  Weeks    Status  On-going    Target Date  09/22/19      PT LONG TERM GOAL #4   Title  Pt. will demonstrate incorporation of deep-core into her regular exercise and running technique to prevent return of Sx.    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    Time  6    Period  Weeks    Status  New    Target Date  09/22/19            Plan - 08/28/19 1736    Clinical Impression Statement  Pt. Responded well to all interventions today, demonstrating decreased spasm and TTP and increased spinal and sacral mobility as well as understanding and correct performance of all education and exercises provided today. They will continue to benefit from skilled physical therapy to work toward remaining goals and maximize function as well as decrease likelihood of  symptom increase or recurrence.     PT Next Visit Plan  re-assess internally, ask about change since TPDN with E-stim, increase deep-core challenge and assess running, teach how to exercise with use of deep core.    PT Home Exercise Plan  child's pose, frog pose, diaphragmatic breathing, toe stretch, hip-flexor stretch, Posterior pelvic tilt with TA/Oblique timing, toilet meditation, self posterior fourchette release, calf stretch on wall, obliques/TA recruitment with band, mini-marches    Consulted and Agree with Plan of Care  Patient       Patient will benefit from skilled therapeutic intervention in order to improve the following deficits and impairments:     Visit Diagnosis: Pelvic pain  Other muscle spasm     Problem List Patient Active Problem List   Diagnosis Date Noted  . Constipation 05/15/2019  . Tendonitis of finger 05/15/2019  . Pelvic pain 05/15/2019   Cleophus Molt DPT, ATC Cleophus Molt 08/28/2019, 5:40 PM  [place] [facility] MAIN [facility] SERVICES [address] [place], [place], [address] Phone: [phone]   Fax:  [phone]  Name: Amiree No MRN: [id number] Date of Birth: 1991-01-09

## 2019-09-08 ENCOUNTER — Ambulatory Visit: Payer: BC Managed Care – PPO | Attending: Obstetrics and Gynecology

## 2019-09-08 ENCOUNTER — Other Ambulatory Visit: Payer: Self-pay

## 2019-09-08 DIAGNOSIS — R102 Pelvic and perineal pain: Secondary | ICD-10-CM | POA: Insufficient documentation

## 2019-09-08 DIAGNOSIS — M62838 Other muscle spasm: Secondary | ICD-10-CM | POA: Insufficient documentation

## 2019-09-08 NOTE — Therapy (Signed)
[place] MAIN [facility] SERVICES [address] [place], [place], [address] Phone: [phone]   Fax:  [phone]  Physical Therapy Treatment  The patient has been informed of current processes in place at Outpatient Rehab to protect patients from Covid-19 exposure including social distancing, schedule modifications, and new cleaning procedures. After discussing their particular risk with a therapist based on the patient's personal risk factors, the patient has decided to proceed with in-person therapy.   Patient Details  Name: Joyce Stewart MRN: [id number] Date of Birth: January 06, 1991 No data recorded  Encounter Date: 09/08/2019  PT End of Session - 09/09/19 1656    Visit Number  11    Number of Visits  12    Date for PT Re-Evaluation  07/25/19    Authorization - Visit Number  2    Authorization - Number of Visits  3    Progress Note Due on Visit  12    PT Start Time  1600    PT Stop Time  1700    PT Time Calculation (min)  60 min    Activity Tolerance  Patient tolerated treatment well;No increased pain    Behavior During Therapy  [facility] for tasks assessed/performed       No past medical history on file.  No past surgical history on file.  There were no vitals filed for this visit.   Pelvic Floor Physical Therapy Treatment Note  SCREENING  Changes in medications, allergies, or medical history?: none  SUBJECTIVE  Patient reports: Has had a BM 8/14 days and had a little dull achyness around the tailbone last week but it went away, may have been aggravated by sitting in the car for hours on the way to and [place] the beach.   Precautions:  none  Sexual activity/pain: Yes, with deeper thrusting. Hurts more if she has not had a BM.  Location of pain: tailbone Current pain: 0/10  Max pain: 6/10 Least pain: 0/10 Nature of pain:tight and stingy   Patient Goals: To be able to have a at least BM 4 times per week. And see if pelvic pain  can be resolved.  OBJECTIVE  Changes in: Posture/Observations:  Hyperlordosis and tendency to arch back with exercise  Range of Motion/Flexibilty:   Strength/MMT:  LE MMT:   Pelvic Floor External Exam: Introitus Appears: WNL Skin integrity: WNL Palpation: no TTP Cough: paradoxical (corrected with VC) Prolapse visible?: none Scar mobility: N/A  Internal Vaginal Exam: Strength (PERF): 5/5, 10 sec, 3 time Symmetry: similar Palpation: no TTP Prolapse: none  Abdominal:  Pt. Able to decrease rib-flare and engage TA with visual and verbal cues for exercises.  Palpation:  Gait Analysis:  INTERVENTIONS THIS SESSION: Manual:  Re-assessed PFM and found parodoxical contraction with cough and slight difficulty with coordination but Pt. Was able to correct with cueing. Educated on pre-squeeze and sneeze to improve coordination and how long to hold a kegel if she wishes in the future to do strengthening.  Therex: Had Pt. Perform overhead [employer] press, rows, dead-lifts, squats, triceps extensions etc. With cueing and explanation of how to engage deep-core to prevent irritating the T-L junction with exercise.    Gait-training: educated on and practiced walking, power-walking, and running mechanics to improve ground reaction force dispersion and prevent spasms and nerve root compression/return of Sx.  Total time: 60 min.  PT Short Term Goals - 08/06/19 1803      PT SHORT TERM GOAL #1   Title  Patient will demonstrate improved Sacral mobility and balance of musculature surrounding the pelvis to facilitate decreased PFM spasms and decrease pelvic pain.    Baseline  Anterior pelvic tilt, spasms surrounding L>R hip, LB tightness    Time  5    Period  Weeks    Status  Achieved    Target Date  06/20/19      PT SHORT TERM GOAL #2   Title  Patient will demonstrate a coordinated contraction, relaxation, and bulge of the pelvic  floor muscles to demonstrate functional recruitment and motion and allow for improved ease of BM.Marland Kitchen    Baseline  Pt. Hx. suggests poor PFM coordination and sensation leading to chronic constipation.    Time  5    Period  Weeks    Status  Achieved    Target Date  06/20/19      PT SHORT TERM GOAL #3   Title  Patient will demonstrate HEP x1 in the clinic to demonstrate understanding and proper form to allow for further improvement.    Baseline  Pt. lacks knowledge of what therapeutic exercises will help decrease her Sx. , Pt. does high-impact exercise 3x/wk    Time  5    Period  Weeks    Status  Achieved    Target Date  06/20/19        PT Long Term Goals - 08/06/19 1804      PT LONG TERM GOAL #1   Title  Patient will report no pain with intercourse to demonstrate improved functional ability.    Baseline  Pt. has pain with deeper thrusting    Time  10    Period  Weeks    Status  Achieved    Target Date  07/25/19      PT LONG TERM GOAL #2   Title  Patient will report having BM's at least every-other day with consistency between [facility] stool scale 3-5 over the prior week to demonstrate decreased constipation.    Baseline  Pt. does not have sensation to have BM, going ~ 3x/week with use of Metamucil, Senna, and colace    Time  10    Period  Weeks    Status  Achieved    Target Date  07/25/19      PT LONG TERM GOAL #3   Title  Pt. will improve in FOTO Bowel Cnst score by 20 points to demonstrate improved function.    Baseline  FOTO PFDI Pain: 4, Bowel Cnst: 41, PFDI Bowel: 4 As of 5/5: Improved by 4 points in pain (maximal score), improved by 6 points in constipation, maintained score for PFDI bowel (0 47 4)    Time  10    Period  Weeks    Status  On-going    Target Date  09/22/19      PT LONG TERM GOAL #4   Title  Pt. will demonstrate incorporation of deep-core into her regular exercise and running technique to prevent return of Sx.    Baseline  Pt. 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    Time  6    Period  Weeks    Status  New    Target Date  09/22/19            Plan - 09/09/19 1657  Clinical Impression Statement  Pt. Responded well to all interventions today, demonstrating resolution of all PFM spasms and pain as well as improved understanding and correct performance of all education and exercises provided today. They will continue to benefit from skilled physical therapy to work toward remaining goals and maximize function as well as decrease likelihood of symptom increase or recurrence.     PT Next Visit Plan  review running and walking mechanics, review HEP, up-grade PRN and give Pt. instructions on how to switch to maintenence mode gradually, re-assess goals and D/C    PT Home Exercise Plan  child's pose, frog pose, diaphragmatic breathing, toe stretch, hip-flexor stretch, Posterior pelvic tilt with TA/Oblique timing, toilet meditation, self posterior fourchette release, calf stretch on wall, obliques/TA recruitment with band, mini-marches, running and walking mechanics, exercise precautions    Consulted and Agree with Plan of Care  Patient       Patient will benefit from skilled therapeutic intervention in order to improve the following deficits and impairments:     Visit Diagnosis: Pelvic pain  Other muscle spasm     Problem List Patient Active Problem List   Diagnosis Date Noted  . Constipation 05/15/2019  . Tendonitis of finger 05/15/2019  . Pelvic pain 05/15/2019   Cleophus Molt DPT, ATC Cleophus Molt 09/09/2019, 5:00 PM  [place] [facility] MAIN [facility] SERVICES [address] [place], [place], [address] Phone: [phone]   Fax:  [phone]  Name: Joyce Stewart MRN: [id number] Date of Birth: 07-24-1990

## 2019-09-08 NOTE — Patient Instructions (Signed)
   Start by leaning forward as a single unit then "catching" yourself to find the angle of forward lean that is appropriate. Minimize "bobbing" up and down and take faster, slightly shorter steps as if you are just repeatedly catching yourself as you lean forward rather than trying to "push" yourself forward.   When walking: make sure that you are keeping your shoulders relaxed and letting the trunk twist the opposite direction of the foot that is stepping forward to allow for some rotation to happen in your middle.   For weightlifting: make sure you Exhale on Exertion (the hard part) and keep the pelvis slightly tucked under and/or with your weight back on your heels when possible.  For jumping: make sure you lean forward a little to keep your pelvis neutral (water in the bucket) and land on soft knees and ankles to help spread out the force of the impact with the floor.

## 2019-09-24 ENCOUNTER — Other Ambulatory Visit: Payer: Self-pay

## 2019-09-24 ENCOUNTER — Ambulatory Visit: Payer: BC Managed Care – PPO

## 2019-09-24 DIAGNOSIS — R102 Pelvic and perineal pain: Secondary | ICD-10-CM

## 2019-09-24 DIAGNOSIS — M62838 Other muscle spasm: Secondary | ICD-10-CM

## 2019-09-24 NOTE — Patient Instructions (Signed)
   Tuck your hips under, then keep the tuck as you lean forward so you feel a stretch through your low back. Hold for 5 deep breaths, repeat 2-3 times, 1-2 times per day.  replace mini-marches with toe-taps. Start with knees up and core engaged, do 4 taps, alternating. Then rest for ~ 1-2 sec. And do 10 total.

## 2019-09-24 NOTE — Therapy (Signed)
[place] [facility] MAIN [facility] SERVICES [address] [place], [place], [address] Phone: [phone]   Fax:  [phone]  Physical Therapy Treatment  The patient has been informed of current processes in place at Outpatient Rehab to protect patients from Covid-19 exposure including social distancing, schedule modifications, and new cleaning procedures. After discussing their particular risk with a therapist based on the patient's personal risk factors, the patient has decided to proceed with in-person therapy.   Patient Details  Name: Joyce Stewart MRN: [id number] Date of Birth: [birth year] No data recorded  Encounter Date: 09/24/2019   PT End of Session - 09/29/19 0737    Visit Number 11    Number of Visits 12    Date for PT Re-Evaluation 07/25/19    Authorization - Visit Number 2    Authorization - Number of Visits 3    Progress Note Due on Visit 12    PT Start Time 1730    PT Stop Time 1830    PT Time Calculation (min) 60 min    Activity Tolerance Patient tolerated treatment well;No increased pain    Behavior During Therapy [facility] for tasks assessed/performed           No past medical history on file.  No past surgical history on file.  There were no vitals filed for this visit.     Pelvic Floor Physical Therapy Treatment Note  SCREENING  Changes in medications, allergies, or medical history?: none  SUBJECTIVE  Patient reports: "so far so good", has had ~ 10 BM's since last visit. Has tried 2 high intensity workouts with jumping over the past 2 days and did feel like at one point she was too fatigued in the lower abdomen and felt like she needed to stop. Gets a slight "tugging" at the urethra when she is almost done urinating that is slightly uncomfortable (new).  Has not had pain with a BM. Has only needed mirilax 2 times last week and once so far this week. Went on a trip and used her low back pillow. Has been doing yoga every day and is  getting good at the child's pose   Precautions:  none  Sexual activity/pain: Yes, with deeper thrusting. Hurts more if she has not had a BM.  Location of pain: tailbone Current pain: 0/10  Max pain: 6/10 Least pain: 0/10 Nature of pain:tight and stingy   Patient Goals: To be able to have a at least BM 4 times per week. And see if pelvic pain can be resolved.  OBJECTIVE  Changes in: Posture/Observations:  Mild R anterior rotation  Range of Motion/Flexibilty:   Strength/MMT:  LE MMT:   Pelvic Floor (from prior session) [External Exam: Introitus Appears: WNL Skin integrity: WNL Palpation: no TTP Cough: paradoxical (corrected with VC) Prolapse visible?: none Scar mobility: N/A  Internal Vaginal Exam: Strength (PERF): 5/5, 10 sec, 3 time Symmetry: similar Palpation: no TTP Prolapse: none]  Abdominal:  Pt. Able to decrease rib-flare and engage TA with visual and verbal cues for exercises. (from prior note)  Palpation: TTP to R Iliacus   Gait Analysis: Pt. Wanting to push off through the calf with running, has difficulty with short strides, has a hard time looking forward while angling toward the ground.   In walking Pt. Heel-striking hard, instructed to have softer knees to minimize GRF  INTERVENTIONS THIS SESSION: Manual:  Re-assessed pelvic alignment and performed TP release to R iliacus to decrease spasm and pain and allow  for improved balance of musculature and pelvic alignment for improved function and decreased symptoms.  Therex: Reviewed mini-marches and up-graded to toe-taps to increase deep-core stability. Reviewed what an appropriate level of intensity is for exercise as pt. Had jumped from low-impact to high-impact and became symptomatic again, instructed to slowly increase demand with jumping type motions rather than doing a whole class of it. Educated on and practiced LB stretch To maintain and improve muscle length and allow for improved  balance of musculature for long-term symptom relief.  Gait-training: Reviewed walking and running mechanics and further educated on how to minimize GRF to decrease the tension that she holds in her muscles to prevent return of Sx.  Total time: 60 min.                             PT Short Term Goals - 08/06/19 1803      PT SHORT TERM GOAL #1   Title Patient will demonstrate improved Sacral mobility and balance of musculature surrounding the pelvis to facilitate decreased PFM spasms and decrease pelvic pain.    Baseline Anterior pelvic tilt, spasms surrounding L>R hip, LB tightness    Time 5    Period Weeks    Status Achieved    Target Date 06/20/19      PT SHORT TERM GOAL #2   Title Patient will demonstrate a coordinated contraction, relaxation, and bulge of the pelvic floor muscles to demonstrate functional recruitment and motion and allow for improved ease of BM.Marland Kitchen    Baseline Pt. Hx. suggests poor PFM coordination and sensation leading to chronic constipation.    Time 5    Period Weeks    Status Achieved    Target Date 06/20/19      PT SHORT TERM GOAL #3   Title Patient will demonstrate HEP x1 in the clinic to demonstrate understanding and proper form to allow for further improvement.    Baseline Pt. lacks knowledge of what therapeutic exercises will help decrease her Sx. , Pt. does high-impact exercise 3x/wk    Time 5    Period Weeks    Status Achieved    Target Date 06/20/19             PT Long Term Goals - 08/06/19 1804      PT LONG TERM GOAL #1   Title Patient will report no pain with intercourse to demonstrate improved functional ability.    Baseline Pt. has pain with deeper thrusting    Time 10    Period Weeks    Status Achieved    Target Date 07/25/19      PT LONG TERM GOAL #2   Title Patient will report having BM's at least every-other day with consistency between [facility] stool scale 3-5 over the prior week to demonstrate decreased  constipation.    Baseline Pt. does not have sensation to have BM, going ~ 3x/week with use of Metamucil, Senna, and colace    Time 10    Period Weeks    Status Achieved    Target Date 07/25/19      PT LONG TERM GOAL #3   Title Pt. will improve in FOTO Bowel Cnst score by 20 points to demonstrate improved function.    Baseline FOTO PFDI Pain: 4, Bowel Cnst: 41, PFDI Bowel: 4 As of 5/5: Improved by 4 points in pain (maximal score), improved by 6 points in constipation, maintained score for PFDI bowel (  0 47 4)    Time 10    Period Weeks    Status On-going    Target Date 09/22/19      PT LONG TERM GOAL #4   Title Pt. will demonstrate incorporation of deep-core into her regular exercise and running technique to prevent return of Sx.    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    Time 6    Period Weeks    Status New    Target Date 09/22/19                 Plan - 09/29/19 0738    Clinical Impression Statement Pt. Responded well to all interventions today, demonstrating improved pelvic alignment, decreased pain, improved understanding of HEP and gait mechanics, as well as understanding and correct performance of all education and exercises provided today. They will continue to benefit from skilled physical therapy to work toward remaining goals and maximize function as well as decrease likelihood of symptom increase or recurrence.    PT Next Visit Plan review running and walking mechanics, review HEP, up-grade PRN and give Pt. instructions on how to switch to maintenence mode gradually, re-assess goals and D/C    PT Home Exercise Plan child's pose, frog pose, diaphragmatic breathing, toe stretch, hip-flexor stretch, Posterior pelvic tilt with TA/Oblique timing, toilet meditation, self posterior fourchette release, calf stretch on wall, obliques/TA recruitment with band, toe-taps, running and walking mechanics,  exercise precautions, LB stretch in seated    Consulted and Agree with Plan of Care Patient           Patient will benefit from skilled therapeutic intervention in order to improve the following deficits and impairments:     Visit Diagnosis: Pelvic pain  Other muscle spasm     Problem List Patient Active Problem List   Diagnosis Date Noted  . Constipation 05/15/2019  . Tendonitis of finger 05/15/2019  . Pelvic pain 05/15/2019   Willa Rough DPT, ATC Willa Rough 09/29/2019, 7:40 AM  [place] MAIN [facility] SERVICES [address] [place], [place], [address] Phone: [phone]   Fax:  [phone]  Name: Brittaney Beaulieu MRN: [id number] Date of Birth: Feb 17, 1991

## 2019-10-07 ENCOUNTER — Other Ambulatory Visit: Payer: Self-pay

## 2019-10-07 ENCOUNTER — Ambulatory Visit: Payer: BC Managed Care – PPO | Attending: Obstetrics and Gynecology

## 2019-10-07 DIAGNOSIS — M62838 Other muscle spasm: Secondary | ICD-10-CM | POA: Insufficient documentation

## 2019-10-07 DIAGNOSIS — R102 Pelvic and perineal pain: Secondary | ICD-10-CM | POA: Insufficient documentation

## 2019-10-07 NOTE — Patient Instructions (Signed)
Cat / Cow Flow    Exhale, press spine toward ceiling like a Halloween cat. Keeping strength in arms and abdominals, Inhale to soften spine through neutral and into cow pose. Open chest and arch back. Initiate movement between cat and cow at tailbone, one vertebrae at a time. Repeat __30__ times.  For running: keep ~ 25% low tummy muscle on, relax neck, soft feet/knees, lean forward with shoulders, let upper body swing (like when you walk).

## 2019-10-07 NOTE — Therapy (Signed)
[place] [facility] MAIN [facility] SERVICES [address] [place], [place], [address] Phone: [phone]   Fax:  [phone]  Physical Therapy Treatment and Discharge Summary  The patient has been informed of current processes in place at Outpatient Rehab to protect patients from Covid-19 exposure including social distancing, schedule modifications, and new cleaning procedures. After discussing their particular risk with a therapist based on the patient's personal risk factors, the patient has decided to proceed with in-person therapy.   Patient Details  Name: Joyce Stewart MRN: [id number] Date of Birth: 1990-06-13 No data recorded  Encounter Date: 10/07/2019   PT End of Session - 10/07/19 1615    Visit Number 12    Number of Visits 12    Date for PT Re-Evaluation 07/25/19    Authorization - Visit Number 3    Authorization - Number of Visits 3    Progress Note Due on Visit 12    PT Start Time 1600    PT Stop Time 1700    PT Time Calculation (min) 60 min    Activity Tolerance Patient tolerated treatment well;No increased pain    Behavior During Therapy [facility] for tasks assessed/performed           No past medical history on file.  No past surgical history on file.  There were no vitals filed for this visit.    Pelvic Floor Physical Therapy Treatment Note  SCREENING  Changes in medications, allergies, or medical history?: none  SUBJECTIVE  Patient reports: Has been running ~ 1 mile, ran 3 times last week and got really tight in her hip-flexors. Has done 1 out of 3 "jumping exercises with her video classes and doing well so far. Having a hard time with high-knees/ Feels like she was tired when doing them. Had a stressful event over the weekend and was travelling and did not have a BM over the weekend but was able to have a BM once she came home. It was going well other than that. Sometimes when she is doing mountain climbers and planks etc. She can feel  a burning in her back and she thinks this means she needs to stop/rest. Has not had to take Mirilax in 10 days! No pain with intercourse and no tailbone pain!  Precautions:  none  Sexual activity/pain: Yes, with deeper thrusting. Hurts more if she has not had a BM.  Location of pain: tailbone Current pain: 0/10  Max pain: 0/10 Least pain: 0/10 Nature of pain:tight and stingy   Patient Goals: To be able to have a at least BM 4 times per week. And see if pelvic pain can be resolved.  OBJECTIVE  Changes in: Posture/Observations:  WNL  Range of Motion/Flexibilty:  Decreased PIVM between L4-S1  Strength/MMT:  LE MMT:   Pelvic Floor (from prior session) [External Exam: Introitus Appears: WNL Skin integrity: WNL Palpation: no TTP Cough: paradoxical (corrected with VC) Prolapse visible?: none Scar mobility: N/A  Internal Vaginal Exam: Strength (PERF): 5/5, 10 sec, 3 time Symmetry: similar Palpation: no TTP Prolapse: none]  Abdominal:  Pt. Able to decrease rib-flare and engage TA with visual and verbal cues for exercises. (from prior note)  Palpation:  Gait Analysis: Pt. Impacting the ground with stiff joints and rounding forward rather than hinging, had head facing up.  INTERVENTIONS THIS SESSION: Manual:  Performed PA mobs to L4-L5-S1 to allow for more freedom of motion through the low back to allow for greater deep-core recruitment and prevent return  of spasms.   Therex: Reviewed toe-taps to emphasize keeping the low back flat as she drops the legs individually and taught progression with legs extending for further deep-core strengthening to increase deep-core stability. Reviewed how to listen to her body for cues regarding the intensity of her exercise and when to take a break or switch to a different exercise. Reviewed and practiced LB stretch and educated on and practiced Cat-cow for mobility and to maintain and improve muscle length and allow for improved  balance of musculature for long-term symptom relief.  Gait-training: Reviewed running mechanics and further educated on how engage the deep core and let gravity propel her as well as how to keep the joints soft to minimize hip-flexor and neck strain/pain and protect the hips/knees/LB.  Total time: 60 min.                             PT Short Term Goals - 10/08/19 1610      PT SHORT TERM GOAL #1   Title Patient will demonstrate improved Sacral mobility and balance of musculature surrounding the pelvis to facilitate decreased PFM spasms and decrease pelvic pain.    Baseline Anterior pelvic tilt, spasms surrounding L>R hip, LB tightness    Time 5    Period Weeks    Status Achieved    Target Date 06/20/19      PT SHORT TERM GOAL #2   Title Patient will demonstrate a coordinated contraction, relaxation, and bulge of the pelvic floor muscles to demonstrate functional recruitment and motion and allow for improved ease of BM.Marland Kitchen    Baseline Pt. Hx. suggests poor PFM coordination and sensation leading to chronic constipation.    Time 5    Period Weeks    Status Achieved    Target Date 06/20/19      PT SHORT TERM GOAL #3   Title Patient will demonstrate HEP x1 in the clinic to demonstrate understanding and proper form to allow for further improvement.    Baseline Pt. lacks knowledge of what therapeutic exercises will help decrease her Sx. , Pt. does high-impact exercise 3x/wk    Time 5    Period Weeks    Status Achieved    Target Date 06/20/19             PT Long Term Goals - 10/07/19 1620      PT LONG TERM GOAL #1   Title Patient will report no pain with intercourse to demonstrate improved functional ability.    Baseline Pt. has pain with deeper thrusting    Time 10    Period Weeks    Status Achieved    Target Date 07/25/19      PT LONG TERM GOAL #2   Title Patient will report having BM's at least every-other day with consistency between [facility] stool  scale 3-5 over the prior week to demonstrate decreased constipation.    Baseline Pt. does not have sensation to have BM, going ~ 3x/week with use of Metamucil, Senna, and colace    Time 10    Period Weeks    Status Achieved    Target Date 07/25/19      PT LONG TERM GOAL #3   Title Pt. will improve in FOTO Bowel Cnst score by 20 points to demonstrate improved function.    Baseline FOTO PFDI Pain: 4, Bowel Cnst: 41, PFDI Bowel: 4 As of 5/5: Improved by 4 points in pain (maximal  score), improved by 6 points in constipation, maintained score for PFDI bowel (0 47 4)  As of 7/6: (4, 60, 0)    Time 10    Period Weeks    Status Achieved    Target Date 09/22/19      PT LONG TERM GOAL #4   Title Pt. will demonstrate incorporation of deep-core into her regular exercise and running technique to prevent return of Sx.    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    Time 6    Period Weeks    Status Achieved    Target Date 09/22/19                 Plan - 10/07/19 1615    Clinical Impression Statement Pt. has achieved all goals and has demonstrated appropriate performance of and understanding of her HEP and how to use it to continue to maintain improvement following D/C. We will D/C to HEP at this time.    PT Next Visit Plan D/C    PT Home Exercise Plan child's pose, frog pose, diaphragmatic breathing, toe stretch, hip-flexor stretch, Posterior pelvic tilt with TA/Oblique timing, toilet meditation, self posterior fourchette release, calf stretch on wall, obliques/TA recruitment with band, toe-taps, running and walking mechanics, exercise precautions, LB stretch in seated, cat-cow    Consulted and Agree with Plan of Care Patient           Patient will benefit from skilled therapeutic intervention in order to improve the following deficits and impairments:     Visit Diagnosis: Pelvic pain  Other muscle  spasm     Problem List Patient Active Problem List   Diagnosis Date Noted  . Constipation 05/15/2019  . Tendonitis of finger 05/15/2019  . Pelvic pain 05/15/2019   Cleophus Molt DPT, ATC Cleophus Molt 10/08/2019, 8:33 AM  [facility] MAIN [facility] SERVICES [address] [place], [place], [address] Phone: [phone]   Fax:  [phone]  Name: Valaree Fresquez MRN: [id number] Date of Birth: 09-Apr-1990

## 2020-01-09 ENCOUNTER — Ambulatory Visit
Admission: RE | Admit: 2020-01-09 | Discharge: 2020-01-09 | Disposition: A | Discharge status: Home / Self Care | Payer: BC Managed Care – PPO | Source: Ambulatory Visit | Attending: Obstetrics and Gynecology | Admitting: Obstetrics and Gynecology

## 2020-01-09 ENCOUNTER — Other Ambulatory Visit: Payer: Self-pay

## 2020-01-09 DIAGNOSIS — N6320 Unspecified lump in the left breast, unspecified quadrant: Secondary | ICD-10-CM | POA: Insufficient documentation

## 2021-01-10 ENCOUNTER — Other Ambulatory Visit: Payer: Self-pay | Admitting: Obstetrics and Gynecology

## 2021-01-10 ENCOUNTER — Other Ambulatory Visit: Payer: Self-pay | Admitting: Nurse Practitioner

## 2021-01-10 DIAGNOSIS — N6320 Unspecified lump in the left breast, unspecified quadrant: Secondary | ICD-10-CM

## 2021-01-25 ENCOUNTER — Ambulatory Visit
Admission: RE | Admit: 2021-01-25 | Discharge: 2021-01-25 | Disposition: A | Discharge status: Home / Self Care | Payer: 59 | Source: Ambulatory Visit | Attending: Obstetrics and Gynecology | Admitting: Obstetrics and Gynecology

## 2021-01-25 ENCOUNTER — Other Ambulatory Visit: Payer: Self-pay

## 2021-01-25 DIAGNOSIS — N6320 Unspecified lump in the left breast, unspecified quadrant: Secondary | ICD-10-CM | POA: Insufficient documentation

## 2021-08-10 ENCOUNTER — Other Ambulatory Visit: Payer: Self-pay | Admitting: Obstetrics and Gynecology

## 2021-08-10 DIAGNOSIS — Z3141 Encounter for fertility testing: Secondary | ICD-10-CM

## 2021-08-10 DIAGNOSIS — Z3169 Encounter for other general counseling and advice on procreation: Secondary | ICD-10-CM

## 2021-08-12 ENCOUNTER — Ambulatory Visit
Admission: RE | Admit: 2021-08-12 | Discharge: 2021-08-12 | Disposition: A | Discharge status: Home / Self Care | Payer: 59 | Source: Ambulatory Visit | Attending: Obstetrics and Gynecology | Admitting: Obstetrics and Gynecology

## 2021-08-12 ENCOUNTER — Encounter: Payer: Self-pay | Admitting: Obstetrics and Gynecology

## 2021-08-12 DIAGNOSIS — Z3141 Encounter for fertility testing: Secondary | ICD-10-CM | POA: Insufficient documentation

## 2021-08-12 DIAGNOSIS — Z3169 Encounter for other general counseling and advice on procreation: Secondary | ICD-10-CM | POA: Diagnosis not present

## 2021-08-12 MED ORDER — IOHEXOL 300 MG/ML  SOLN
30.0000 mL | Freq: Once | INTRAMUSCULAR | Status: AC | PRN
  Administered 2021-08-12: 3 mL

## 2021-08-12 NOTE — Progress Notes (Unsigned)
[id number] ? ?Jul 03, 1990 31 y.o. ?08/12/21 ? ?Christeen Douglas, MD ? ?Hysterosalpingogram Procedure Note ? ?Date of procedure: 08/12/2021  ? ?Pre-operative Diagnosis: Investigation of conception ? ?Post-operative Diagnosis: same, bilateral tubal patency ? ?Procedure: Hysterosalpingogram ? ?Surgeon: Cline Cools, MD ? ?Assistant(s):  Radiology assistant. The radiologist present for today read the imaging and agreed with findings below. ? ?Anesthesia: None ? ?Estimated Blood Loss:  None ?        ?Complications:  None; patient tolerated the procedure well. ?        ?Disposition: To home ?        ?Condition: stable ? ?Findings: ?Bilateral fill and spill of the tubes and a normal endometrial contour was noted. ? ?Procedure Details ? ?HSG procedure discussed with the patient.  Risks, complications, alternatives have been reviewed with her and she agrees to proceed. ?  ?The patient presented to the radiology lab and was identified as the correct patient and the procedure verified as an HSG. A verbal Time Out was held with all team members present and in agreement. ? ?Speculum was inserted in to the vagina and the cervix visualized.  The cervix was cleaned with betadine solution. The HSG catheter was inserted and the balloon insufflated with approximately 1.5 ml of air.  Patient was then repositioned for fluoroscopy.  A total of 2 ml of contrast was used for the procedure. The patient tolerated the procedure well, no complications.  ? ?Bilateral fill and spill of the tubes and a normal endometrial contour was noted. ? ?Results were reviewed with the patient at the time of the procedure. She verbalized understanding. ? ? ?Christeen Douglas, MD ?08/12/2021 ?

## 2021-10-07 DIAGNOSIS — N912 Amenorrhea, unspecified: Secondary | ICD-10-CM | POA: Insufficient documentation

## 2021-10-25 ENCOUNTER — Encounter: Payer: 59 | Admitting: Advanced Practice Midwife

## 2021-10-25 ENCOUNTER — Other Ambulatory Visit (HOSPITAL_COMMUNITY)
Admission: RE | Admit: 2021-10-25 | Discharge: 2021-10-25 | Disposition: A | Discharge status: Home / Self Care | Payer: BC Managed Care – PPO | Source: Ambulatory Visit | Attending: Family Medicine | Admitting: Family Medicine

## 2021-10-25 ENCOUNTER — Encounter: Payer: Self-pay | Admitting: Advanced Practice Midwife

## 2021-10-25 ENCOUNTER — Ambulatory Visit (INDEPENDENT_AMBULATORY_CARE_PROVIDER_SITE_OTHER): Payer: BC Managed Care – PPO | Admitting: Advanced Practice Midwife

## 2021-10-25 VITALS — BP 108/65 | HR 80 | Ht 64.0 in | Wt 125.0 lb

## 2021-10-25 DIAGNOSIS — O3443 Maternal care for other abnormalities of cervix, third trimester: Secondary | ICD-10-CM | POA: Diagnosis not present

## 2021-10-25 DIAGNOSIS — Z3481 Encounter for supervision of other normal pregnancy, first trimester: Secondary | ICD-10-CM

## 2021-10-25 DIAGNOSIS — Z3A11 11 weeks gestation of pregnancy: Secondary | ICD-10-CM

## 2021-10-25 DIAGNOSIS — Z3401 Encounter for supervision of normal first pregnancy, first trimester: Secondary | ICD-10-CM | POA: Insufficient documentation

## 2021-10-25 DIAGNOSIS — O26891 Other specified pregnancy related conditions, first trimester: Secondary | ICD-10-CM

## 2021-10-25 DIAGNOSIS — Z3A31 31 weeks gestation of pregnancy: Secondary | ICD-10-CM | POA: Diagnosis not present

## 2021-10-25 DIAGNOSIS — R87612 Low grade squamous intraepithelial lesion on cytologic smear of cervix (LGSIL): Secondary | ICD-10-CM | POA: Diagnosis not present

## 2021-10-25 NOTE — Progress Notes (Unsigned)
Last Pap Sept 22, 2022 [facility] clinic) abnormal pap. Has hx of pelvic floor PT. Patient states she already had the inheriTest (duke version) Joyce Stewart  Patient and patient husband would like to proceed with labwork needed for todays visit. Made them aware that they will be responsible for any bills not covered by their insurance. Patients husband states that he has verification that insurance will back date to July 1,2023. Made husband aware that the insurance they presents is not going through our Epic system as active today 10-25-21. Lister Brizzi HowardRN   DATING AND VIABILITY SONOGRAM   [name] is a 31 y.o. year old G3P0020 with LMP Patient's last menstrual period was 08/09/2021. which would correlate to  [redacted]w[redacted]d weeks gestation.  She has {Desc; regular/irreg:14544} menstrual cycles.   She is here today for a confirmatory initial sonogram.    GESTATION: [place]     FETAL ACTIVITY:          Heart rate         171 bpm          The fetus is active.    ADNEXA: The ovaries are normal.   GESTATIONAL AGE AND  BIOMETRICS:  Gestational criteria: Estimated Date of Delivery: 05/16/22 by LMP now at [redacted]w[redacted]d  Previous Scans:1      CROWN RUMP LENGTH           3.93 cm         10-6 weeks                                                                               AVERAGE EGA(BY THIS SCAN):  10-6 weeks  WORKING EDD( LMP ):  05-16-2022     TECHNICIAN COMMENTS:  Patient informed that the ultrasound is considered a limited obstetric ultrasound and is not intended to be a complete ultrasound exam.  Patient also informed that the ultrasound is not being completed with the intent of assessing for fetal or placental anomalies or any pelvic abnormalities. Explained that the purpose of today's ultrasound is to assess for fetal heart rate.  Patient acknowledges the purpose of the exam and the limitations of the study.    [name] 10/25/2021 10:30 AM

## 2021-10-26 LAB — CBC/D/PLT+RPR+RH+ABO+RUBIGG...
Antibody Screen: NEGATIVE
Basophils Absolute: 0 10*3/uL (ref 0.0–0.2)
Basos: 0 %
EOS (ABSOLUTE): 0.1 10*3/uL (ref 0.0–0.4)
Eos: 1 %
HCV Ab: NONREACTIVE
HIV Screen 4th Generation wRfx: NONREACTIVE
Hematocrit: 36.7 % (ref 34.0–46.6)
Hemoglobin: 12.8 g/dL (ref 11.1–15.9)
Hepatitis B Surface Ag: NEGATIVE
Immature Grans (Abs): 0 10*3/uL (ref 0.0–0.1)
Immature Granulocytes: 0 %
Lymphocytes Absolute: 1.8 10*3/uL (ref 0.7–3.1)
Lymphs: 25 %
MCH: 31.7 pg (ref 26.6–33.0)
MCHC: 34.9 g/dL (ref 31.5–35.7)
MCV: 91 fL (ref 79–97)
Monocytes Absolute: 0.6 10*3/uL (ref 0.1–0.9)
Monocytes: 9 %
Neutrophils Absolute: 4.5 10*3/uL (ref 1.4–7.0)
Neutrophils: 65 %
Platelets: 310 10*3/uL (ref 150–450)
RBC: 4.04 x10E6/uL (ref 3.77–5.28)
RDW: 13.1 % (ref 11.7–15.4)
RPR Ser Ql: NONREACTIVE
Rh Factor: POSITIVE
Rubella Antibodies, IGG: <0.9 index — ABNORMAL LOW (ref >0.99)
WBC: 7 10*3/uL (ref 3.4–10.8)

## 2021-10-26 LAB — HCV INTERPRETATION

## 2021-10-26 NOTE — Progress Notes (Signed)
INITIAL OBSTETRICAL VISIT Patient name: Joyce Stewart MRN [id number]  Date of birth: 1991-03-16 Chief Complaint:   No chief complaint on file.  History of Present Illness:   Joyce Stewart is a 31 y.o. G34P0020 Caucasian female at [redacted]w[redacted]d by [place] at 16 weeks with an Estimated Date of Delivery: 05/16/22 being seen today for her initial obstetrical visit.   Her obstetrical history is significant for abnormal pap smear (she is unsure what type but did have a colpo last fall), one SAB and one EAB.  Has been seeing a Naturopath ant they recommended supplements and an HSG (08/12/21) which was normal.  She got pregnant soon after that procedure.  Today she reports no complaints.     10/25/2021   10:18 AM  Depression screen PHQ 2/9  Decreased Interest 0  Down, Depressed, Hopeless 0  PHQ - 2 Score 0  Altered sleeping 0  Tired, decreased energy 1  Change in appetite 0  Feeling bad or failure about yourself  0  Trouble concentrating 0  Moving slowly or fidgety/restless 0  Suicidal thoughts 0  PHQ-9 Score 1    Patient's last menstrual period was 08/09/2021. Last pap 2022. Results were: abnormal  possibly ASCUS or LSIL (had colpo and "they did not see any lesions") Review of Systems:   Pertinent items are noted in HPI Denies cramping/contractions, leakage of fluid, vaginal bleeding, abnormal vaginal discharge w/ itching/odor/irritation, headaches, visual changes, shortness of breath, chest pain, abdominal pain, severe nausea/vomiting, or problems with urination or bowel movements unless otherwise stated above.  Pertinent History Reviewed:  Reviewed past medical,surgical, social, obstetrical and family history.  Reviewed problem list, medications and allergies. OB History  Gravida Para Term Preterm AB Living  3       2    SAB IAB Ectopic Multiple Live Births  1 1          # Outcome Date GA Lbr Len/2nd Weight Sex Delivery Anes PTL Lv  3 Current           2 SAB           1 IAB             Physical Assessment:   Vitals:   10/25/21 1000 10/25/21 1001  BP: 108/65   Pulse: 80   Weight: 125 lb (56.7 kg)   Height:  5\' 4"  (1.626 m)  Body mass index is 21.46 kg/m.       Physical Examination:  General appearance - well appearing, and in no distress  Mental status - alert, oriented to person, place, and time  Psych:  She has a normal mood and affect  Skin - warm and dry, normal color, no suspicious lesions noted  Chest - effort normal  Heart - normal rate and regular rhythm  Abdomen - soft, nontender  Extremities:  No swelling or varicosities noted  Pelvic - VULVA: normal appearing vulva with no masses, tenderness or lesions  VAGINA: normal appearing vagina with normal color and discharge, no lesions  CERVIX: normal appearing cervix without discharge or lesions, no CMT  Thin prep pap is done  HR HPV cotesting  Chaperone:  RN       No results found for this or any previous visit (from the past 24 hour(s)).  Assessment & Plan:  1) Low-Risk Pregnancy G3P0020 at [redacted]w[redacted]d with an Estimated Date of Delivery: 05/16/22   2) Initial OB visit  3) History of abnormal pap smear   Initial labs obtained  Continue prenatal vitamins Reviewed n/v relief measures and warning s/s to report Reviewed recommended weight gain based on pre-gravid BMI Encouraged well-balanced diet Genetic & carrier screening discussed: requests Panorama, requests AFP Ultrasound discussed; fetal survey: requested CCNC completed> form faxed if has or is planning to apply for medicaid The nature of [facility] [employer] with multiple MDs and other Advanced Practice Providers was explained to patient; also emphasized that fellows, residents, and students are part of our team.   Indications for ASA therapy (per uptodate) One of the following: H/O preeclampsia, especially early onset/adverse outcome No Multifetal gestation No CHTN No T1DM or T2DM No Chronic kidney  disease No Autoimmune disease (antiphospholipid syndrome, systemic lupus erythematosus) No  OR Two or more of the following: Nulliparity Yes Obesity (BMI>30 kg/m2) No Family h/o preeclampsia in mother or sister No Age ?35 years No Sociodemographic characteristics (African American race, low socioeconomic level) No Personal risk factors (eg, previous pregnancy w/ LBW or SGA, previous adverse pregnancy outcome [eg, stillbirth], interval >10 years between pregnancies) No  Indications for early A1C (per uptodate) BMI >=25 (>=23 in Asian women) AND one of the following GDM in a previous pregnancy No Previous A1C?5.7, impaired glucose tolerance, or impaired fasting glucose on previous testing No First-degree relative with diabetes No High-risk race/ethnicity (eg, African American, Latino, Native American, Asian American, Pacific Islander) No History of cardiovascular disease No HTN or on therapy for hypertension No HDL cholesterol level <35 mg/dL (7.61 mmol/L) and/or a triglyceride level >250 mg/dL (6.07 mmol/L) No PCOS No Physical inactivity No Other clinical condition associated with insulin resistance (eg, severe obesity, acanthosis nigricans) No Previous birth of an infant weighing ?4000 g No Previous stillbirth of unknown cause No >= 40yo No  Follow-up: 4 weeks  Orders Placed This Encounter  Procedures   Urine Culture   [place] MFM OB COMP + 14 WK   CBC/D/Plt+RPR+Rh+ABO+RubIgG...   Panorama Prenatal Test Full Panel   Interpretation:   Ambulatory referral to Physical Therapy    Wynelle Bourgeois CNM, [facility] 10/26/2021 9:17 PM

## 2021-10-27 LAB — URINE CULTURE

## 2021-10-31 LAB — PANORAMA PRENATAL TEST FULL PANEL:PANORAMA TEST PLUS 5 ADDITIONAL MICRODELETIONS: FETAL FRACTION: 13.2

## 2021-11-01 LAB — CYTOLOGY - PAP
Chlamydia: NEGATIVE
Comment: NEGATIVE
Comment: NEGATIVE
Comment: NORMAL
High risk HPV: POSITIVE — AB
Neisseria Gonorrhea: NEGATIVE

## 2021-11-08 ENCOUNTER — Telehealth: Payer: Self-pay

## 2021-11-08 NOTE — Telephone Encounter (Signed)
Left message for patient to return call to office for pap results and need for colpo (added to next OB appt). Victorino Dike [facility]

## 2021-11-08 NOTE — Telephone Encounter (Signed)
-----   Message from Aviva Signs, CNM sent at 11/08/2021  1:24 PM EDT ----- Regarding: needs colpo Abnormal pap at new OB  Needs to be notified and scheduled for colpo with MD  Thanks  Hilda Lias

## 2021-11-11 NOTE — Telephone Encounter (Signed)
Patient called the office back requesting results. 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 Patient is scheduled for a Colpo on 12/26/21. Understanding was voiced. Nayla Dias l Fatih Stalvey, CMA

## 2021-11-14 ENCOUNTER — Ambulatory Visit: Payer: BC Managed Care – PPO | Attending: Advanced Practice Midwife | Admitting: Physical Therapy

## 2021-11-14 ENCOUNTER — Other Ambulatory Visit: Payer: Self-pay

## 2021-11-14 DIAGNOSIS — O26891 Other specified pregnancy related conditions, first trimester: Secondary | ICD-10-CM | POA: Insufficient documentation

## 2021-11-14 DIAGNOSIS — R279 Unspecified lack of coordination: Secondary | ICD-10-CM | POA: Diagnosis present

## 2021-11-14 DIAGNOSIS — R102 Pelvic and perineal pain: Secondary | ICD-10-CM | POA: Diagnosis not present

## 2021-11-14 DIAGNOSIS — Z3401 Encounter for supervision of normal first pregnancy, first trimester: Secondary | ICD-10-CM | POA: Insufficient documentation

## 2021-11-14 DIAGNOSIS — M6281 Muscle weakness (generalized): Secondary | ICD-10-CM | POA: Insufficient documentation

## 2021-11-14 DIAGNOSIS — Z3A11 11 weeks gestation of pregnancy: Secondary | ICD-10-CM | POA: Insufficient documentation

## 2021-11-14 DIAGNOSIS — Z3481 Encounter for supervision of other normal pregnancy, first trimester: Secondary | ICD-10-CM | POA: Insufficient documentation

## 2021-11-14 NOTE — Therapy (Signed)
OUTPATIENT PHYSICAL THERAPY FEMALE PELVIC EVALUATION   Patient Name: Joyce Stewart MRN: [id number] DOB:1990-08-11, 31 y.o., female Today's Date: 11/14/2021   PT End of Session - 11/14/21 0759     Visit Number 1    Date for PT Re-Evaluation 02/14/22    Authorization Type BCBS    PT Start Time 0800    PT Stop Time 0840    PT Time Calculation (min) 40 min    Activity Tolerance Patient tolerated treatment well    Behavior During Therapy [facility] for tasks assessed/performed             Past Medical History:  Diagnosis Date   Vaginal Pap smear, abnormal    No past surgical history on file. Patient Active Problem List   Diagnosis Date Noted   Constipation 05/15/2019   Tendonitis of finger 05/15/2019   Pelvic pain 05/15/2019    PCP: Randa Ngo, CNM  REFERRING PROVIDER: Aviva Signs, CNM  REFERRING DIAG: Z3A.11 (ICD-10-CM) - [redacted] weeks gestation of pregnancy Z34.01 (ICD-10-CM) - Encounter for supervision of normal first pregnancy in first trimester Z34.81 (ICD-10-CM) - Encounter for supervision of other normal pregnancy in first trimester  THERAPY DIAG:  Muscle weakness (generalized)  Unspecified lack of coordination  Other muscle spasm  Abnormal posture  Rationale for Evaluation and Treatment Rehabilitation  ONSET DATE: [redacted] weeks pregnant  SUBJECTIVE:                                                                                                                                                                                           SUBJECTIVE STATEMENT: Py has a history of pelvic floor pain and constipation a few years ago this has resolved by pt referred for preventative mobility during pregnancy.   Pt reports she is also taking an Omega and prenatal vitmain Fluid intake: Yes: 8-10 glasses of water per day does drink sparkling water sometimes     PAIN:  Are you having pain? No   PRECAUTIONS: Other: [redacted] weeks pregnant  WEIGHT BEARING RESTRICTIONS  No  FALLS:  Has patient fallen in last 6 months? No  LIVING ENVIRONMENT: Lives with: lives with their family Lives in: House/apartment   OCCUPATION: teacher  PLOF: Independent  PATIENT GOALS to learn more about mobility during pregnancy.   PERTINENT HISTORY:  2 previous miscarriages, currently low risk pregnancy Sexual abuse: No  BOWEL MOVEMENT Pain with bowel movement: No Type of bowel movement:Type (Bristol Stool Scale) 4, Frequency daily to every other day, and Strain No Fully empty rectum: No Leakage: No Pads: No Fiber supplement: No  URINATION Pain with urination: No Fully empty bladder:  No Stream: Strong Urgency: No Frequency: not quicker than every 2 hours Leakage:  no Pads: No  INTERCOURSE Pain with intercourse:  not painful Ability to have vaginal penetration:  Yes:   Climax: not painful Marinoff Scale: 0/3  PREGNANCY Vaginal deliveries 0 Tearing No C-section deliveries 0 Currently pregnant Yes: 13 weeks Has had 2 miscarriages PROLAPSE None    OBJECTIVE:   DIAGNOSTIC FINDINGS:    COGNITION:  Overall cognitive status: Within functional limits for tasks assessed     SENSATION:  Light touch: Appears intact  Proprioception: Appears intact  MUSCLE LENGTH: Bil hamstrings and adductors limited by 25%; bil hip IR limited by 25%                POSTURE: No Significant postural limitations   LUMBARAROM/PROM  A/PROM A/PROM  eval  Flexion Decreased by 25%  Extension WFL  Right lateral flexion WFL  Left lateral flexion WFL  Right rotation WFL  Left rotation WFL   (Blank rows = not tested)  LOWER EXTREMITY ROM:  WFL  LOWER EXTREMITY MMT:  Bil hips grossly 4/5; knees and ankles 5/5  PALPATION:   General  no TTP or fascial restrictions noted  at eval                External Perineal Exam deferred                             Internal Pelvic Floor deferred  Patient confirms identification and approves PT to assess internal  pelvic floor and treatment No  PELVIC MMT:   MMT eval  Vaginal   Internal Anal Sphincter   External Anal Sphincter   Puborectalis   Diastasis Recti   (Blank rows = not tested)        TONE: deferred  PROLAPSE: deferred  TODAY'S TREATMENT  EVAL Examination completed, findings reviewed, pt educated on POC, HEP. Pt motivated to participate in PT and agreeable to attempt recommendations.     PATIENT EDUCATION:  Education details: [id number] Person educated: Patient Education method: Explanation, Demonstration, Tactile cues, Verbal cues, and Handouts Education comprehension: verbalized understanding and returned demonstration   HOME EXERCISE PROGRAM: [id number]  ASSESSMENT:  CLINICAL IMPRESSION: Patient is a 31 y.o. female  who was seen today for physical therapy evaluation and treatment for education on mobility and strengthening during pregnancy and to establish postpartum care. Pt presents with third pregnancy, she had two miscarriages previously and currently is [redacted] weeks pregnant with low risk pregnancy. Pt reports she had pelvic floor PT previously for pelvic pain and constipation, these have resolved with PT and have not returned but was referred to prevent pelvic floor pain and tension from resuming during pregnancy and to educate pt on labor pelvic floor relaxation. Pt denied any concerns at this time but did want to to prevent any return of pelvic floor pain. Pt given HEP for hip stretching, core and hip strengthening, and pelvic floor relaxation. Pt also educated on POC and denied all questions. Pt would benefit from additional PT to further address deficits.     OBJECTIVE IMPAIRMENTS decreased strength and improper body mechanics.   ACTIVITY LIMITATIONS  pregnancy and postpartum education and care as needed  PARTICIPATION LIMITATIONS: community activity  PERSONAL FACTORS 1 comorbidity: currently pregnant  are also affecting patient's functional outcome.   REHAB  POTENTIAL: Good  CLINICAL DECISION MAKING: Stable/uncomplicated  EVALUATION COMPLEXITY: Low   GOALS: Goals reviewed with patient? Yes  SHORT TERM GOALS: Target date: 12/12/2021  Pt to be I with HEP.  Baseline: Goal status: INITIAL  2.  Pt to demonstrate good body mechanics and core activation with 10# squats and lunges without pain or adverse symptoms for improved stability for child care postpartum.  Baseline:  Goal status: INITIAL  3.  Pt to be I with recall of breathing mechanics and pelvic relaxation for full mobility of pelvic floor without pain to prepare to delivery.  Baseline:  Goal status: INITIAL   LONG TERM GOALS: Target date:  02/14/22    Pt to be I with advanced HEP.  Baseline:  Goal status: INITIAL  2.  Pt to demonstrate good body mechanics and core activation with 20# squats and lunges without pain or adverse symptoms for improved stability for child care postpartum.  Baseline:  Goal status: INITIAL  3.  Pt to be I with recall of comfort positions for labor preparation for improved thoracic and pelvic mobility during labor and postpartum. Baseline:  Goal status: INITIAL  4.  Pt to demonstrate at least 5/5 bil hip strength for improved pelvic stability and functional squats without leakage.  Baseline:  Goal status: INITIAL   PLAN: PT FREQUENCY: every other week  PT DURATION:  10 sessions  PLANNED INTERVENTIONS: Therapeutic exercises, Therapeutic activity, Neuromuscular re-education, Patient/Family education, Self Care, Joint mobilization, Aquatic Therapy, Dry Needling, Spinal mobilization, Cryotherapy, Moist heat, Taping, and Biofeedback  PLAN FOR NEXT SESSION: core and hip strengthening, thoracic stretching and mobility, hip stretching, body mechanics for functional strengthening      Otelia Sergeant, PT, DPT 08/14/239:55 AM

## 2021-11-22 ENCOUNTER — Ambulatory Visit (INDEPENDENT_AMBULATORY_CARE_PROVIDER_SITE_OTHER): Payer: BC Managed Care – PPO | Admitting: Advanced Practice Midwife

## 2021-11-22 ENCOUNTER — Encounter: Payer: Self-pay | Admitting: Advanced Practice Midwife

## 2021-11-22 VITALS — BP 99/57 | HR 77 | Wt 131.0 lb

## 2021-11-22 DIAGNOSIS — Z3482 Encounter for supervision of other normal pregnancy, second trimester: Secondary | ICD-10-CM

## 2021-11-22 DIAGNOSIS — Z3A15 15 weeks gestation of pregnancy: Secondary | ICD-10-CM

## 2021-11-22 DIAGNOSIS — R87612 Low grade squamous intraepithelial lesion on cytologic smear of cervix (LGSIL): Secondary | ICD-10-CM

## 2021-11-22 DIAGNOSIS — Z3480 Encounter for supervision of other normal pregnancy, unspecified trimester: Secondary | ICD-10-CM | POA: Insufficient documentation

## 2021-11-22 NOTE — Progress Notes (Signed)
   PRENATAL VISIT NOTE  Subjective:  Joyce Stewart is a 31 y.o. G3P0020 at [redacted]w[redacted]d being seen today for ongoing prenatal care.  She is currently monitored for the following issues for this low-risk pregnancy and has Chronic constipation; Tendonitis of finger; Pelvic pain in female; Amenorrhea; and Supervision of other normal pregnancy, antepartum on their problem list.  Patient reports no complaints.  Contractions: Not present. Vag. Bleeding: None.  Movement: Absent. Denies leaking of fluid.   The following portions of the patient's history were reviewed and updated as appropriate: allergies, current medications, past family history, past medical history, past social history, past surgical history and problem list.   Objective:   Vitals:   11/22/21 1120  BP: (!) 99/57  Pulse: 77  Weight: 131 lb (59.4 kg)    Fetal Status: Fetal Heart Rate (bpm): 156   Movement: Absent     General:  Alert, oriented and cooperative. Patient is in no acute distress.  Skin: Skin is warm and dry. No rash noted.   Cardiovascular: Normal heart rate noted  Respiratory: Normal respiratory effort, no problems with respiration noted  Abdomen: Soft, gravid, appropriate for gestational age.  Pain/Pressure: Present     Pelvic: Cervical exam deferred        Extremities: Normal range of motion.  Edema: None  Mental Status: Normal mood and affect. Normal behavior. Normal judgment and thought content.   Assessment and Plan:  Pregnancy: G3P0020 at [redacted]w[redacted]d 1. Supervision of other normal pregnancy, antepartum      Answered questions about weight gain, lab results, PP doula, Need for C/S, Pap/colpo/treatment/HPV - AFP, Serum, Open Spina Bifida  2. [redacted] weeks gestation of pregnancy      Anticipatory guidance  3. LGSIL on Pap smear of cervix     Has appt for Colpo     Discussed may or may not need biopsy.  Dr Macon Large will discuss  Preterm labor symptoms and general obstetric precautions including but not limited to  vaginal bleeding, contractions, leaking of fluid and fetal movement were reviewed in detail with the patient. Please refer to After Visit Summary for other counseling recommendations.   Return in about 4 weeks (around 12/20/2021) for [facility].  Future Appointments  Date Time Provider Department Center  12/26/2021 10:55 AM Anyanwu, Jethro Bastos, MD [facility] None  12/26/2021  1:30 PM [facility] US3 [facility]  12/29/2021  2:45 PM Barbaraann Faster, PT [facility] None  01/25/2022  3:30 PM Barbaraann Faster, PT [facility] None  01/26/2022  3:50 PM Levie Heritage, DO [facility] None  03/01/2022  3:30 PM Barbaraann Faster, PT [facility] None  03/20/2022  3:30 PM Barbaraann Faster, PT [facility] None  04/24/2022  3:30 PM Barbaraann Faster, PT [facility] None    Wynelle Bourgeois, CNM

## 2021-11-22 NOTE — Addendum Note (Signed)
Addended by: Lorelle Gibbs L on: 11/22/2021 01:07 PM   Modules accepted: Orders

## 2021-11-24 ENCOUNTER — Encounter: Payer: 59 | Admitting: Family Medicine

## 2021-11-30 ENCOUNTER — Other Ambulatory Visit: Payer: BC Managed Care – PPO

## 2021-11-30 NOTE — Progress Notes (Signed)
Patient sent to lab for blood draw. Cletus Paris RN 

## 2021-11-30 NOTE — Addendum Note (Signed)
Addended by: Anell Barr on: 11/30/2021 04:33 PM   Modules accepted: Orders

## 2021-11-30 NOTE — Addendum Note (Signed)
Addended by: Mikey Bussing on: 11/30/2021 04:38 PM   Modules accepted: Orders

## 2021-12-01 ENCOUNTER — Encounter: Payer: Self-pay | Admitting: General Practice

## 2021-12-01 ENCOUNTER — Other Ambulatory Visit (INDEPENDENT_AMBULATORY_CARE_PROVIDER_SITE_OTHER): Payer: BC Managed Care – PPO

## 2021-12-01 DIAGNOSIS — Z3A16 16 weeks gestation of pregnancy: Secondary | ICD-10-CM

## 2021-12-03 LAB — AFP, SERUM, OPEN SPINA BIFIDA
AFP MoM: 1.13
AFP Value: 43.9 ng/mL
Gest. Age on Collection Date: 16.2 weeks
Maternal Age At EDD: 30.7 yr
OSBR Risk 1 IN: 8106
Test Results:: NEGATIVE
Weight: 131 [lb_av]

## 2021-12-26 ENCOUNTER — Ambulatory Visit: Payer: BC Managed Care – PPO | Attending: Advanced Practice Midwife

## 2021-12-26 ENCOUNTER — Encounter: Payer: Self-pay | Admitting: Obstetrics & Gynecology

## 2021-12-26 ENCOUNTER — Ambulatory Visit (INDEPENDENT_AMBULATORY_CARE_PROVIDER_SITE_OTHER): Payer: BC Managed Care – PPO | Admitting: Obstetrics & Gynecology

## 2021-12-26 VITALS — BP 101/58 | HR 77 | Wt 132.0 lb

## 2021-12-26 DIAGNOSIS — Z3A19 19 weeks gestation of pregnancy: Secondary | ICD-10-CM | POA: Diagnosis not present

## 2021-12-26 DIAGNOSIS — Z3A11 11 weeks gestation of pregnancy: Secondary | ICD-10-CM

## 2021-12-26 DIAGNOSIS — Z3401 Encounter for supervision of normal first pregnancy, first trimester: Secondary | ICD-10-CM | POA: Diagnosis not present

## 2021-12-26 DIAGNOSIS — O3442 Maternal care for other abnormalities of cervix, second trimester: Secondary | ICD-10-CM

## 2021-12-26 DIAGNOSIS — Z3481 Encounter for supervision of other normal pregnancy, first trimester: Secondary | ICD-10-CM | POA: Diagnosis not present

## 2021-12-26 DIAGNOSIS — R87612 Low grade squamous intraepithelial lesion on cytologic smear of cervix (LGSIL): Secondary | ICD-10-CM

## 2021-12-26 DIAGNOSIS — Z363 Encounter for antenatal screening for malformations: Secondary | ICD-10-CM | POA: Diagnosis not present

## 2021-12-26 DIAGNOSIS — Z3480 Encounter for supervision of other normal pregnancy, unspecified trimester: Secondary | ICD-10-CM

## 2021-12-26 NOTE — Progress Notes (Signed)
   PRENATAL VISIT NOTE  Subjective:  Joyce Stewart is a 31 y.o. G3P0020 at [redacted]w[redacted]d being seen today for ongoing prenatal care.  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  Patient reports no complaints.  Contractions: Not present. Vag. Bleeding: None.  Movement: Absent. Denies leaking of fluid.   The following portions of the patient's history were reviewed and updated as appropriate: allergies, current medications, past family history, past medical history, past social history, past surgical history and problem list.   Objective:   Vitals:   12/26/21 1100  BP: (!) 101/58  Pulse: 77  Weight: 132 lb (59.9 kg)    Fetal Status: Fetal Heart Rate (bpm): 155   Movement: Absent     General:  Alert, oriented and cooperative. Patient is in no acute distress.  Skin: Skin is warm and dry. No rash noted.   Cardiovascular: Normal heart rate noted  Respiratory: Normal respiratory effort, no problems with respiration noted  Abdomen: Soft, gravid, appropriate for gestational age.  Pain/Pressure: Present     Pelvic: Cervical exam deferred        Extremities: Normal range of motion.  Edema: None  Mental Status: Normal mood and affect. Normal behavior. Normal judgment and thought content.   COLPOSCOPY PROCEDURE NOTE 31 y.o. [id number] at [redacted]w[redacted]d here for colposcopy for low-grade squamous intraepithelial neoplasia (LGSIL - encompassing HPV,mild dysplasia,CIN I) pap smear on 10/25/21. Discussed role for HPV in cervical dysplasia, need for surveillance.  Patient gave informed written consent, time out was performed.  Placed in lithotomy position. Cervix viewed with speculum and colposcope after application of acetic acid. Chaperone was present during entire procedure.  Colposcopy adequate? Yes Mild acetowhite lesion noted at 5 o'clock; no biopsy  obtained.  No ECC specimen obtained due to pregnancy. Likely CIN I or less, will follow up postpartum  Assessment and Plan:  Pregnancy: G3P0020 at [redacted]w[redacted]d 1. 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 [redacted] weeks gestation of pregnancy 3. Supervision of other normal pregnancy, antepartum LR NIPS, AFP negative. Anatomy scan later today, will follow up results and manage accordingly. Answered various questions about pregnancy. Preterm labor symptoms and general obstetric precautions including but not limited to vaginal bleeding, contractions, leaking of fluid and fetal movement were reviewed in detail with the patient. Please refer to After Visit Summary for other counseling recommendations.   Return in about 4 weeks (around 01/23/2022) for OFFICE OB VISIT (MD or APP).  Future Appointments  Date Time Provider [place]  12/26/2021  1:30 PM [facility] US3 [facility]  01/25/2022  3:30 PM Junie Panning, PT [facility] None  01/26/2022  3:50 PM Truett Mainland, DO [facility] None  02/28/2022  8:35 AM Seabron Spates, CNM [facility] None  03/01/2022  3:30 PM Junie Panning, PT [facility] None  03/20/2022  3:30 PM Junie Panning, PT [facility] None  04/24/2022  3:30 PM Junie Panning, PT [facility] None    Verita Schneiders, MD

## 2021-12-29 ENCOUNTER — Ambulatory Visit: Payer: BC Managed Care – PPO | Admitting: Physical Therapy

## 2022-01-25 ENCOUNTER — Ambulatory Visit: Payer: BC Managed Care – PPO | Attending: Advanced Practice Midwife | Admitting: Physical Therapy

## 2022-01-25 DIAGNOSIS — M6281 Muscle weakness (generalized): Secondary | ICD-10-CM | POA: Diagnosis present

## 2022-01-25 DIAGNOSIS — R293 Abnormal posture: Secondary | ICD-10-CM | POA: Insufficient documentation

## 2022-01-25 NOTE — Therapy (Signed)
OUTPATIENT PHYSICAL THERAPY FEMALE PELVIC TREATMENT   Patient Name: Joyce Stewart MRN: [id number] DOB:01/15/1991, 31 y.o., female Today's Date: 01/25/2022   PT End of Session - 01/25/22 1531     Visit Number 2    Date for PT Re-Evaluation 02/14/22    Authorization Type BCBS    PT Start Time 1530    PT Stop Time 1610    PT Time Calculation (min) 40 min    Activity Tolerance Patient tolerated treatment well    Behavior During Therapy [facility] for tasks assessed/performed             Past Medical History:  Diagnosis Date   Chronic constipation 10/18/2017   Tendonitis of finger 05/15/2019   L thumb   Vaginal Pap smear, abnormal    No past surgical history on file. 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 12/26/2021   Supervision of other normal pregnancy, antepartum 11/22/2021    PCP: Francetta Found, CNM  REFERRING PROVIDER: Seabron Spates, CNM  REFERRING DIAG: Z3A.11 (ICD-10-CM) - [redacted] weeks gestation of pregnancy Z34.01 (ICD-10-CM) - Encounter for supervision of normal first pregnancy in first trimester Z34.81 (ICD-10-CM) - Encounter for supervision of other normal pregnancy in first trimester  THERAPY DIAG:  Muscle weakness (generalized)  Abnormal posture  Rationale for Evaluation and Treatment Rehabilitation  ONSET DATE: [redacted] weeks pregnant  SUBJECTIVE:                                                                                                                                                                                           SUBJECTIVE STATEMENT: "I've been feeling pretty good."  Pt reports she is also taking an Omega and prenatal vitmain Fluid intake: Yes: 8-10 glasses of water per day does drink sparkling water sometimes     PAIN:  Are you having pain? No   PRECAUTIONS: Other: [redacted] weeks pregnant  WEIGHT BEARING RESTRICTIONS No  FALLS:  Has patient fallen in last 6 months? No  LIVING  ENVIRONMENT: Lives with: lives with their family Lives in: House/apartment   OCCUPATION: teacher  PLOF: Independent  PATIENT GOALS to learn more about mobility during pregnancy.   PERTINENT HISTORY:  2 previous miscarriages, currently low risk pregnancy Sexual abuse: No  BOWEL MOVEMENT Pain with bowel movement: No Type of bowel movement:Type (Bristol Stool Scale) 4, Frequency daily to every other day, and Strain No Fully empty rectum: No Leakage: No Pads: No Fiber supplement: No  URINATION Pain with urination: No Fully empty bladder: No Stream: Strong Urgency: No Frequency: not quicker than every  2 hours Leakage:  no Pads: No  INTERCOURSE Pain with intercourse:  not painful Ability to have vaginal penetration:  Yes:   Climax: not painful Marinoff Scale: 0/3  PREGNANCY Vaginal deliveries 0 Tearing No C-section deliveries 0 Currently pregnant Yes: 13 weeks Has had 2 miscarriages PROLAPSE None    OBJECTIVE:   DIAGNOSTIC FINDINGS:    COGNITION:  Overall cognitive status: Within functional limits for tasks assessed     SENSATION:  Light touch: Appears intact  Proprioception: Appears intact  MUSCLE LENGTH: Bil hamstrings and adductors limited by 25%; bil hip IR limited by 25%                POSTURE: No Significant postural limitations   LUMBARAROM/PROM  A/PROM A/PROM  eval  Flexion Decreased by 25%  Extension WFL  Right lateral flexion WFL  Left lateral flexion WFL  Right rotation WFL  Left rotation WFL   (Blank rows = not tested)  LOWER EXTREMITY ROM:  WFL  LOWER EXTREMITY MMT:  Bil hips grossly 4/5; knees and ankles 5/5  PALPATION:   General  no TTP or fascial restrictions noted  at eval                External Perineal Exam deferred                             Internal Pelvic Floor deferred  Patient confirms identification and approves PT to assess internal pelvic floor and treatment No  PELVIC MMT:   MMT eval  Vaginal    Internal Anal Sphincter   External Anal Sphincter   Puborectalis   Diastasis Recti   (Blank rows = not tested)        TONE: deferred  PROLAPSE: deferred  TODAY'S TREATMENT  SELF CARE - pt educated on HEP updates, pt reports she feels her back has been more tight and she has been having more coning of abdominals. Pt educated on proper techniques/modifications for sitting<>standing, log rolling to/out of bed, improved core activations.  NMRE: all tasks and exercises cued for breathing and core activations to prevent coning Log rolling sit<>supine x4 each way with cues for breathing mechanics and core activation needed throughout. Initially coning above umbilicus seen but improved with TA activations 2x10 diaphragmatic breathing and TA activations in sitting X10 Sit to stand from chair with breathing mechanics and TA activation Bear plank x5 with TA activation and breathing mechanics cued for improved muscle control.    PATIENT EDUCATION:  Education details: [id number] Person educated: Patient Education method: Consulting civil engineer, Demonstration, Tactile cues, Verbal cues, and Handouts Education comprehension: verbalized understanding and returned demonstration   HOME EXERCISE PROGRAM: [id number]  ASSESSMENT:  CLINICAL IMPRESSION: Patient session focused on education on movement modifications during pregnancy to limit/prevent abdominal coning as pt initially demonstrates this with transfers to/from sitting, in/out of bed, trunk extension stretches, and with exercises. NMRE for all exercises to correct movement pattern and promote improved techniques and coordination. Pt able to complete tasks with greatly decreased coning. Pt would benefit from additional PT to further address deficits.     OBJECTIVE IMPAIRMENTS decreased strength and improper body mechanics.   ACTIVITY LIMITATIONS  pregnancy and postpartum education and care as needed  PARTICIPATION LIMITATIONS: community  activity  PERSONAL FACTORS 1 comorbidity: currently pregnant  are also affecting patient's functional outcome.   REHAB POTENTIAL: Good  CLINICAL DECISION MAKING: Stable/uncomplicated  EVALUATION COMPLEXITY: Low   GOALS: Goals reviewed  with patient? Yes  SHORT TERM GOALS: Target date: 12/12/2021  Pt to be I with HEP.  Baseline: Goal status: MET  2.  Pt to demonstrate good body mechanics and core activation with 10# squats and lunges without pain or adverse symptoms for improved stability for child care postpartum.  Baseline:  Goal status: on going  3.  Pt to be I with recall of breathing mechanics and pelvic relaxation for full mobility of pelvic floor without pain to prepare to delivery.  Baseline:  Goal status: on going   LONG TERM GOALS: Target date:  02/14/22    Pt to be I with advanced HEP.  Baseline:  Goal status: INITIAL  2.  Pt to demonstrate good body mechanics and core activation with 20# squats and lunges without pain or adverse symptoms for improved stability for child care postpartum.  Baseline:  Goal status: INITIAL  3.  Pt to be I with recall of comfort positions for labor preparation for improved thoracic and pelvic mobility during labor and postpartum. Baseline:  Goal status: INITIAL  4.  Pt to demonstrate at least 5/5 bil hip strength for improved pelvic stability and functional squats without leakage.  Baseline:  Goal status: INITIAL   PLAN: PT FREQUENCY: every other week  PT DURATION:  10 sessions  PLANNED INTERVENTIONS: Therapeutic exercises, Therapeutic activity, Neuromuscular re-education, Patient/Family education, Self Care, Joint mobilization, Aquatic Therapy, Dry Needling, Spinal mobilization, Cryotherapy, Moist heat, Taping, and Biofeedback  PLAN FOR NEXT SESSION: core and hip strengthening, thoracic stretching and mobility, hip stretching, body mechanics for functional strengthening      Stacy Gardner, PT, DPT 10/25/234:13 PM

## 2022-01-26 ENCOUNTER — Ambulatory Visit (INDEPENDENT_AMBULATORY_CARE_PROVIDER_SITE_OTHER): Payer: BC Managed Care – PPO | Admitting: Family Medicine

## 2022-01-26 VITALS — BP 116/71 | HR 74 | Wt 138.0 lb

## 2022-01-26 DIAGNOSIS — Z3482 Encounter for supervision of other normal pregnancy, second trimester: Secondary | ICD-10-CM

## 2022-01-26 DIAGNOSIS — Z3480 Encounter for supervision of other normal pregnancy, unspecified trimester: Secondary | ICD-10-CM

## 2022-01-26 DIAGNOSIS — R87612 Low grade squamous intraepithelial lesion on cytologic smear of cervix (LGSIL): Secondary | ICD-10-CM

## 2022-01-26 DIAGNOSIS — Z3A24 24 weeks gestation of pregnancy: Secondary | ICD-10-CM

## 2022-01-26 NOTE — Progress Notes (Signed)
   PRENATAL VISIT NOTE  Subjective:  Joyce Stewart is a 31 y.o. G3P0020 at [redacted]w[redacted]d being seen today for ongoing prenatal care.  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  Patient reports no complaints.  Contractions: Not present. Vag. Bleeding: None.  Movement: Present. Denies leaking of fluid.   The following portions of the patient's history were reviewed and updated as appropriate: allergies, current medications, past family history, past medical history, past social history, past surgical history and problem list.   Objective:   Vitals:   01/26/22 1554  BP: 116/71  Pulse: 74  Weight: 138 lb (62.6 kg)    Fetal Status: Fetal Heart Rate (bpm): 145   Movement: Present     General:  Alert, oriented and cooperative. Patient is in no acute distress.  Skin: Skin is warm and dry. No rash noted.   Cardiovascular: Normal heart rate noted  Respiratory: Normal respiratory effort, no problems with respiration noted  Abdomen: Soft, gravid, appropriate for gestational age.  Pain/Pressure: Absent     Pelvic: Cervical exam deferred        Extremities: Normal range of motion.  Edema: None  Mental Status: Normal mood and affect. Normal behavior. Normal judgment and thought content.   Assessment and Plan:  Pregnancy: [id number] at [redacted]w[redacted]d 1. Supervision of other normal pregnancy, antepartum FHT and FH normal  2. 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 Please refer to After Visit Summary for other counseling recommendations.   No follow-ups on file.  Future Appointments  Date Time Provider [place]   02/28/2022  8:35 AM Seabron Spates, CNM [facility] None  03/01/2022  3:30 PM Junie Panning, PT [facility] None  03/16/2022  1:30 PM Truett Mainland, DO [facility] None  03/20/2022  3:30 PM Junie Panning, PT [facility] None  04/24/2022  3:30 PM Junie Panning, PT [facility] None    Truett Mainland, DO

## 2022-01-26 NOTE — Progress Notes (Signed)
Patient complaining of hip pain. Kathrene Alu RN

## 2022-02-28 ENCOUNTER — Ambulatory Visit (INDEPENDENT_AMBULATORY_CARE_PROVIDER_SITE_OTHER): Payer: BC Managed Care – PPO | Admitting: Advanced Practice Midwife

## 2022-02-28 VITALS — BP 95/56 | HR 97 | Wt 140.0 lb

## 2022-02-28 DIAGNOSIS — Z23 Encounter for immunization: Secondary | ICD-10-CM

## 2022-02-28 DIAGNOSIS — Z3A29 29 weeks gestation of pregnancy: Secondary | ICD-10-CM | POA: Diagnosis not present

## 2022-02-28 DIAGNOSIS — Z3483 Encounter for supervision of other normal pregnancy, third trimester: Secondary | ICD-10-CM | POA: Diagnosis not present

## 2022-02-28 DIAGNOSIS — Z3480 Encounter for supervision of other normal pregnancy, unspecified trimester: Secondary | ICD-10-CM

## 2022-02-28 NOTE — Progress Notes (Signed)
   PRENATAL VISIT NOTE  Subjective:  Joyce Stewart is a 31 y.o. G3P0020 at [redacted]w[redacted]d being seen today for ongoing prenatal care.  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  Patient reports no complaints.  Contractions: Not present. Vag. Bleeding: None.  Movement: Present. Denies leaking of fluid.   The following portions of the patient's history were reviewed and updated as appropriate: allergies, current medications, past family history, past medical history, past social history, past surgical history and problem list.   Objective:   Vitals:   02/28/22 0852  BP: (!) 95/56  Pulse: 97  Weight: 140 lb (63.5 kg)    Fetal Status: Fetal Heart Rate (bpm): 146 Fundal Height: 29 cm Movement: Present     General:  Alert, oriented and cooperative. Patient is in no acute distress.  Skin: Skin is warm and dry. No rash noted.   Cardiovascular: Normal heart rate noted  Respiratory: Normal respiratory effort, no problems with respiration noted  Abdomen: Soft, gravid, appropriate for gestational age.  Pain/Pressure: Absent     Pelvic: Cervical exam deferred        Extremities: Normal range of motion.  Edema: None  Mental Status: Normal mood and affect. Normal behavior. Normal judgment and thought content.   Assessment and Plan:  Pregnancy: G3P0020 at [redacted]w[redacted]d 1. Supervision of other normal pregnancy, antepartum  - Glucose Tolerance, 2 Hours w/1 Hour - RPR - HIV antibody (with reflex) - CBC - Tdap vaccine greater than or equal to 7yo IM  2. [redacted] weeks gestation of pregnancy  - Glucose Tolerance, 2 Hours w/1 Hour - RPR - HIV antibody (with reflex) - CBC - Tdap vaccine greater than or equal to 7yo IM  Preterm labor symptoms and general obstetric precautions including but not limited to vaginal bleeding, contractions, leaking of fluid  and fetal movement were reviewed in detail with the patient. Please refer to After Visit Summary for other counseling recommendations.    Future Appointments  Date Time Provider Department Center  03/01/2022  3:30 PM Barbaraann Faster, PT [facility] None  03/16/2022  1:30 PM Levie Heritage, DO [facility] None  03/20/2022  3:30 PM Barbaraann Faster, PT [facility] None  04/05/2022  8:15 AM Anyanwu, Jethro Bastos, MD [facility] None  04/20/2022  2:50 PM Levie Heritage, DO [facility] None  04/24/2022  3:30 PM Barbaraann Faster, PT [facility] None  04/27/2022  4:10 PM Levie Heritage, DO [facility] None  05/04/2022  4:10 PM Adrian Blackwater, Rhona Raider, DO [facility] None    Wynelle Bourgeois, CNM

## 2022-03-01 ENCOUNTER — Ambulatory Visit: Payer: BC Managed Care – PPO | Attending: Advanced Practice Midwife | Admitting: Physical Therapy

## 2022-03-01 DIAGNOSIS — M6281 Muscle weakness (generalized): Secondary | ICD-10-CM | POA: Diagnosis not present

## 2022-03-01 DIAGNOSIS — R293 Abnormal posture: Secondary | ICD-10-CM | POA: Insufficient documentation

## 2022-03-01 LAB — CBC
Hematocrit: 34.4 % (ref 34.0–46.6)
Hemoglobin: 11.7 g/dL (ref 11.1–15.9)
MCH: 33.3 pg — ABNORMAL HIGH (ref 26.6–33.0)
MCHC: 34 g/dL (ref 31.5–35.7)
MCV: 98 fL — ABNORMAL HIGH (ref 79–97)
Platelets: 265 10*3/uL (ref 150–450)
RBC: 3.51 x10E6/uL — ABNORMAL LOW (ref 3.77–5.28)
RDW: 12.1 % (ref 11.7–15.4)
WBC: 7.4 10*3/uL (ref 3.4–10.8)

## 2022-03-01 LAB — GLUCOSE TOLERANCE, 2 HOURS W/ 1HR
Glucose, 1 hour: 94 mg/dL (ref 70–179)
Glucose, 2 hour: 77 mg/dL (ref 70–152)
Glucose, Fasting: 82 mg/dL (ref 70–91)

## 2022-03-01 LAB — HIV ANTIBODY (ROUTINE TESTING W REFLEX): HIV Screen 4th Generation wRfx: NONREACTIVE

## 2022-03-01 LAB — RPR: RPR Ser Ql: NONREACTIVE

## 2022-03-01 NOTE — Therapy (Signed)
OUTPATIENT PHYSICAL THERAPY FEMALE PELVIC TREATMENT   Patient Name: Joyce Stewart MRN: [id number] DOB:01-Oct-1990, 31 y.o., female Today's Date: 03/01/2022   PT End of Session - 03/01/22 1534     Visit Number 3    Date for PT Re-Evaluation 06/01/22    Authorization Type BCBS    PT Start Time 1532    PT Stop Time 1610    PT Time Calculation (min) 38 min    Activity Tolerance Patient tolerated treatment well    Behavior During Therapy [facility] for tasks assessed/performed             Past Medical History:  Diagnosis Date   Chronic constipation 10/18/2017   Tendonitis of finger 05/15/2019   L thumb   Vaginal Pap smear, abnormal    No past surgical history on file. 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 12/26/2021   Supervision of other normal pregnancy, antepartum 11/22/2021    PCP: McVey, Murray Hodgkins, CNM  REFERRING PROVIDER: Seabron Spates, CNM  REFERRING DIAG: Z3A.11 (ICD-10-CM) - [redacted] weeks gestation of pregnancy Z34.01 (ICD-10-CM) - Encounter for supervision of normal first pregnancy in first trimester Z34.81 (ICD-10-CM) - Encounter for supervision of other normal pregnancy in first trimester  THERAPY DIAG:  Muscle weakness (generalized)  Abnormal posture  Rationale for Evaluation and Treatment Rehabilitation  ONSET DATE: [redacted] weeks pregnant  SUBJECTIVE:                                                                                                                                                                                           SUBJECTIVE STATEMENT: Pt reports she has been having Lt thoracic back pain with sitting in hard back chairs or laying on coughing.   Pt reports she is also taking an Omega and prenatal vitmain Fluid intake: Yes: 8-10 glasses of water per day does drink sparkling water sometimes     PAIN:  Are you having pain? Yes 3/10 Lt thoracic spine, burning and achy   PRECAUTIONS:  Other: [redacted] weeks pregnant  WEIGHT BEARING RESTRICTIONS No  FALLS:  Has patient fallen in last 6 months? No  LIVING ENVIRONMENT: Lives with: lives with their family Lives in: House/apartment   OCCUPATION: teacher  PLOF: Independent  PATIENT GOALS to learn more about mobility during pregnancy.   PERTINENT HISTORY:  2 previous miscarriages, currently low risk pregnancy Sexual abuse: No  BOWEL MOVEMENT Pain with bowel movement: No Type of bowel movement:Type (Bristol Stool Scale) 4, Frequency daily to every other day, and Strain No Fully empty rectum: No Leakage: No Pads:  No Fiber supplement: No  URINATION Pain with urination: No Fully empty bladder: No Stream: Strong Urgency: No Frequency: not quicker than every 2 hours Leakage:  no Pads: No  INTERCOURSE Pain with intercourse:  not painful Ability to have vaginal penetration:  Yes:   Climax: not painful Marinoff Scale: 0/3  PREGNANCY Vaginal deliveries 0 Tearing No C-section deliveries 0 Currently pregnant Yes: 13 weeks Has had 2 miscarriages PROLAPSE None    OBJECTIVE:   DIAGNOSTIC FINDINGS:    COGNITION:  Overall cognitive status: Within functional limits for tasks assessed     SENSATION:  Light touch: Appears intact  Proprioception: Appears intact  MUSCLE LENGTH: Bil hamstrings and adductors limited by 25%; bil hip IR limited by 25%                POSTURE: rounded shoulders and anterior pelvic tilt 03/01/22    LUMBARAROM/PROM  A/PROM A/PROM  eval  Flexion Decreased by 25%  Extension WFL  Right lateral flexion WFL  Left lateral flexion WFL  Right rotation WFL  Left rotation WFL   (Blank rows = not tested)  LOWER EXTREMITY ROM:  WFL  LOWER EXTREMITY MMT:  Bil hips grossly 4/5; knees and ankles 5/5  PALPATION:   General  no TTP or fascial restrictions noted  at eval                External Perineal Exam deferred                             Internal Pelvic Floor  deferred  Patient confirms identification and approves PT to assess internal pelvic floor and treatment No  PELVIC MMT:   MMT 03/01/22   Vaginal 4/5 with cues for tech, first attempt downward push but resolved with cues, 7s, 6 reps  Internal Anal Sphincter   External Anal Sphincter   Puborectalis   Diastasis Recti   (Blank rows = not tested)        TONE: deferred  PROLAPSE: deferred  TODAY'S TREATMENT  03/01/22: Pt consented to internal vaginal assessment this date and findings above. No pain internally and mild tension noted at bulbocavernosus and superficial layer but improved with gentle stretching Pt educated on pelvic wand which she brought to session and labor positions with handout provided.  Pt educated these are not to have less pain, quicker labor or uncomplicated labor. Pt should still follow all recommendations from medical providers for care and needs throughout pregnancy and delivery course. Pt verbalized consent.     PATIENT EDUCATION:  Education details: [id number] Person educated: Patient Education method: Consulting civil engineer, Demonstration, Tactile cues, Verbal cues, and Handouts Education comprehension: verbalized understanding and returned demonstration   HOME EXERCISE PROGRAM: [id number]  ASSESSMENT:  CLINICAL IMPRESSION: Patient session focused on education on pelvic wand use as needed for tension or pain with intercourse and to discuss with MD about use for perineal massage at or after 35 weeks, HEP update, labor positions and pt consented to internal assessment and treatment today. Pt tolerated well denied additional questions and would benefit from next session to complete positions with labor positions as needed and insure carry over. Pt would benefit from additional PT to further address deficits and improve hip and core training for pregnancy and decreased back pain and impaired posture with pregnancy.     OBJECTIVE IMPAIRMENTS decreased strength and  improper body mechanics.   ACTIVITY LIMITATIONS  pregnancy and postpartum education and care as  needed  PARTICIPATION LIMITATIONS: community activity  PERSONAL FACTORS 1 comorbidity: currently pregnant  are also affecting patient's functional outcome.   REHAB POTENTIAL: Good  CLINICAL DECISION MAKING: Stable/uncomplicated  EVALUATION COMPLEXITY: Low   GOALS: Goals reviewed with patient? Yes  SHORT TERM GOALS: Target date: 12/12/2021  Pt to be I with HEP.  Baseline: Goal status: MET  2.  Pt to demonstrate good body mechanics and core activation with 10# squats and lunges without pain or adverse symptoms for improved stability for child care postpartum.  Baseline:  Goal status: on going  3.  Pt to be I with recall of breathing mechanics and pelvic relaxation for full mobility of pelvic floor without pain to prepare to delivery.  Baseline:  Goal status: on going   LONG TERM GOALS: Target date:  02/14/22    Pt to be I with advanced HEP.  Baseline:  Goal status: MET  2.  Pt to demonstrate good body mechanics and core activation with 20# squats and lunges without pain or adverse symptoms for improved stability for child care postpartum.  Baseline:  Goal status: on going  3.  Pt to be I with recall of comfort positions for labor preparation for improved thoracic and pelvic mobility during labor and postpartum. Baseline:  Goal status:  on going 4.  Pt to demonstrate at least 5/5 bil hip strength for improved pelvic stability and functional squats without leakage.  Baseline:  Goal status: on going   PLAN: PT FREQUENCY: every other week  PT DURATION:  10 sessions  PLANNED INTERVENTIONS: Therapeutic exercises, Therapeutic activity, Neuromuscular re-education, Patient/Family education, Self Care, Joint mobilization, Aquatic Therapy, Dry Needling, Spinal mobilization, Cryotherapy, Moist heat, Taping, and Biofeedback  PLAN FOR NEXT SESSION: core and hip strengthening,  thoracic stretching and mobility, hip stretching, body mechanics for functional strengthening      Stacy Gardner, PT, DPT 11/29/235:17 PM

## 2022-03-16 ENCOUNTER — Ambulatory Visit (INDEPENDENT_AMBULATORY_CARE_PROVIDER_SITE_OTHER): Payer: BC Managed Care – PPO | Admitting: Family Medicine

## 2022-03-16 VITALS — BP 101/61 | HR 76 | Wt 143.0 lb

## 2022-03-16 DIAGNOSIS — Z3480 Encounter for supervision of other normal pregnancy, unspecified trimester: Secondary | ICD-10-CM

## 2022-03-16 DIAGNOSIS — Z3A31 31 weeks gestation of pregnancy: Secondary | ICD-10-CM

## 2022-03-16 DIAGNOSIS — Z3483 Encounter for supervision of other normal pregnancy, third trimester: Secondary | ICD-10-CM

## 2022-03-16 DIAGNOSIS — O3443 Maternal care for other abnormalities of cervix, third trimester: Secondary | ICD-10-CM

## 2022-03-16 DIAGNOSIS — R87612 Low grade squamous intraepithelial lesion on cytologic smear of cervix (LGSIL): Secondary | ICD-10-CM

## 2022-03-17 NOTE — Progress Notes (Signed)
   PRENATAL VISIT NOTE  Subjective:  Joyce Stewart is a 31 y.o. G3P0020 at [redacted]w[redacted]d being seen today for ongoing prenatal care.  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  Patient reports  having congestion - started about a week or so ago .  Contractions: Not present. Vag. Bleeding: None.  Movement: Present. Denies leaking of fluid.   The following portions of the patient's history were reviewed and updated as appropriate: allergies, current medications, past family history, past medical history, past social history, past surgical history and problem list.   Objective:   Vitals:   03/16/22 1333  BP: 101/61  Pulse: 76  Weight: 143 lb (64.9 kg)    Fetal Status:     Movement: Present     General:  Alert, oriented and cooperative. Patient is in no acute distress.  Skin: Skin is warm and dry. No rash noted.   Cardiovascular: Normal heart rate noted  Respiratory: Normal respiratory effort, no problems with respiration noted  Abdomen: Soft, gravid, appropriate for gestational age.  Pain/Pressure: Absent     Pelvic: Cervical exam deferred        Extremities: Normal range of motion.  Edema: None  Mental Status: Normal mood and affect. Normal behavior. Normal judgment and thought content.   Assessment and Plan:  Pregnancy: G3P0020 at [redacted]w[redacted]d 1. [redacted] weeks gestation of pregnancy  2. Supervision of other normal pregnancy, antepartum FHT and FH normal OTC medications for nasal congestion discussed.  3. 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 F/u postpartum  Preterm labor symptoms and general obstetric precautions including but not limited to vaginal bleeding, contractions, leaking of fluid and fetal movement were reviewed in detail with the patient. Please refer to After Visit Summary for other counseling  recommendations.   No follow-ups on file.  Future Appointments  Date Time Provider Department Center  03/20/2022  3:30 PM Barbaraann Faster, PT [facility] None  04/05/2022  8:15 AM Anyanwu, Jethro Bastos, MD [facility] None  04/20/2022  2:50 PM Levie Heritage, DO [facility] None  04/24/2022  3:30 PM Barbaraann Faster, PT [facility] None  04/27/2022  4:10 PM Levie Heritage, DO [facility] None  05/04/2022  4:10 PM Levie Heritage, DO [facility] None    Levie Heritage, DO

## 2022-03-20 ENCOUNTER — Ambulatory Visit: Payer: BC Managed Care – PPO | Attending: Advanced Practice Midwife | Admitting: Physical Therapy

## 2022-03-20 DIAGNOSIS — M6281 Muscle weakness (generalized): Secondary | ICD-10-CM | POA: Diagnosis present

## 2022-03-20 DIAGNOSIS — R293 Abnormal posture: Secondary | ICD-10-CM | POA: Insufficient documentation

## 2022-03-20 DIAGNOSIS — R279 Unspecified lack of coordination: Secondary | ICD-10-CM | POA: Insufficient documentation

## 2022-03-20 NOTE — Therapy (Addendum)
OUTPATIENT PHYSICAL THERAPY FEMALE PELVIC TREATMENT   Patient Name: Joyce Stewart MRN: [id number] DOB:08/18/1990, 31 y.o., female Today's Date: 03/20/2022   PT End of Session - 03/20/22 1534     Visit Number 4    Date for PT Re-Evaluation 06/01/22    Authorization Type BCBS    PT Start Time 1531    PT Stop Time 1610    PT Time Calculation (min) 39 min    Activity Tolerance Patient tolerated treatment well    Behavior During Therapy [facility] for tasks assessed/performed             Past Medical History:  Diagnosis Date   Chronic constipation 10/18/2017   Tendonitis of finger 05/15/2019   L thumb   Vaginal Pap smear, abnormal    No past surgical history on file. 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 12/26/2021   Supervision of other normal pregnancy, antepartum 11/22/2021    PCP: Francetta Found, CNM  REFERRING PROVIDER: Seabron Spates, CNM  REFERRING DIAG: Z3A.11 (ICD-10-CM) - [redacted] weeks gestation of pregnancy Z34.01 (ICD-10-CM) - Encounter for supervision of normal first pregnancy in first trimester Z34.81 (ICD-10-CM) - Encounter for supervision of other normal pregnancy in first trimester  THERAPY DIAG:  Muscle weakness (generalized)  Abnormal posture  Unspecified lack of coordination  Rationale for Evaluation and Treatment Rehabilitation  ONSET DATE: [redacted] weeks pregnant  SUBJECTIVE:                                                                                                                                                                                           SUBJECTIVE STATEMENT: Pt reports back pain has been better, still there a little but recommendations of posture has helped a lot. Does report Rt hip/knee numbness with sitting longer periods of time but not limiting mobility and no pain, resolves with mobility quickly.   Pt reports she is also taking an Omega and prenatal vitmain Fluid  intake: Yes: 8-10 glasses of water per day does drink sparkling water sometimes     PAIN:  Are you having pain? No    PRECAUTIONS: Other: [redacted] weeks pregnant  WEIGHT BEARING RESTRICTIONS No  FALLS:  Has patient fallen in last 6 months? No  LIVING ENVIRONMENT: Lives with: lives with their family Lives in: House/apartment   OCCUPATION: teacher  PLOF: Independent  PATIENT GOALS to learn more about mobility during pregnancy.   PERTINENT HISTORY:  2 previous miscarriages, currently low risk pregnancy Sexual abuse: No  BOWEL MOVEMENT Pain with bowel movement: No Type of bowel movement:Type (  Bristol Stool Scale) 4, Frequency daily to every other day, and Strain No Fully empty rectum: No Leakage: No Pads: No Fiber supplement: No  URINATION Pain with urination: No Fully empty bladder: No Stream: Strong Urgency: No Frequency: not quicker than every 2 hours Leakage:  no Pads: No  INTERCOURSE Pain with intercourse:  not painful Ability to have vaginal penetration:  Yes:   Climax: not painful Marinoff Scale: 0/3  PREGNANCY Vaginal deliveries 0 Tearing No C-section deliveries 0 Currently pregnant Yes: 13 weeks Has had 2 miscarriages PROLAPSE None    OBJECTIVE:   DIAGNOSTIC FINDINGS:    COGNITION:  Overall cognitive status: Within functional limits for tasks assessed     SENSATION:  Light touch: Appears intact  Proprioception: Appears intact  MUSCLE LENGTH: Bil hamstrings and adductors limited by 25%; bil hip IR limited by 25%                POSTURE: rounded shoulders and anterior pelvic tilt 03/01/22    LUMBARAROM/PROM  A/PROM A/PROM  eval  Flexion Decreased by 25%  Extension WFL  Right lateral flexion WFL  Left lateral flexion WFL  Right rotation WFL  Left rotation WFL   (Blank rows = not tested)  LOWER EXTREMITY ROM:  WFL  LOWER EXTREMITY MMT:  Bil hips grossly 4/5; knees and ankles 5/5  PALPATION:   General  no TTP or fascial  restrictions noted  at eval                External Perineal Exam deferred                             Internal Pelvic Floor deferred  Patient confirms identification and approves PT to assess internal pelvic floor and treatment No  PELVIC MMT:   MMT 03/01/22   Vaginal 4/5 with cues for tech, first attempt downward push but resolved with cues, 7s, 6 reps  Internal Anal Sphincter   External Anal Sphincter   Puborectalis   Diastasis Recti   (Blank rows = not tested)        TONE: deferred  PROLAPSE: deferred  TODAY'S TREATMENT  03/20/22:  Pt educated on updated HEP, mechanics for mobility with progressing pregnancy and breathing mechanics 3x10 diaphragmatic breathing with attempting to relax pelvic floor in sitting to prepare for laboring as able  Lifting mechanics education also completed and handout given    PATIENT EDUCATION:  Education details: [id number] Person educated: Patient Education method: Consulting civil engineer, Demonstration, Tactile cues, Verbal cues, and Handouts Education comprehension: verbalized understanding and returned demonstration   HOME EXERCISE PROGRAM: [id number]  ASSESSMENT:  CLINICAL IMPRESSION: Patient session focused on education on updated HEP, mechanics for mobility with progressing pregnancy and breathing mechanics and lifting mechanics. Pt demonstrated improved posture and reports she has continued to complete HEP and has questions about postpartum mobility as well. Pt also educated post partum would need new referral for change in status and pt agreed. Pt would benefit from additional PT to further address deficits and improve hip and core training for pregnancy and decreased back pain and impaired posture with pregnancy.     OBJECTIVE IMPAIRMENTS decreased strength and improper body mechanics.   ACTIVITY LIMITATIONS  pregnancy and postpartum education and care as needed  PARTICIPATION LIMITATIONS: community activity  PERSONAL FACTORS 1  comorbidity: currently pregnant  are also affecting patient's functional outcome.   REHAB POTENTIAL: Good  CLINICAL DECISION MAKING: Stable/uncomplicated  EVALUATION COMPLEXITY:  Low   GOALS: Goals reviewed with patient? Yes  SHORT TERM GOALS: Target date: 12/12/2021  Pt to be I with HEP.  Baseline: Goal status: MET  2.  Pt to demonstrate good body mechanics and core activation with 10# squats and lunges without pain or adverse symptoms for improved stability for child care postpartum.  Baseline:  Goal status: on going  3.  Pt to be I with recall of breathing mechanics and pelvic relaxation for full mobility of pelvic floor without pain to prepare to delivery.  Baseline:  Goal status: on going   LONG TERM GOALS: Target date:  02/14/22    Pt to be I with advanced HEP.  Baseline:  Goal status: MET  2.  Pt to demonstrate good body mechanics and core activation with 20# squats and lunges without pain or adverse symptoms for improved stability for child care postpartum.  Baseline:  Goal status: on going  3.  Pt to be I with recall of comfort positions for labor preparation for improved thoracic and pelvic mobility during labor and postpartum. Baseline:  Goal status:  on going 4.  Pt to demonstrate at least 5/5 bil hip strength for improved pelvic stability and functional squats without leakage.  Baseline:  Goal status: on going   PLAN: PT FREQUENCY: every other week  PT DURATION:  10 sessions  PLANNED INTERVENTIONS: Therapeutic exercises, Therapeutic activity, Neuromuscular re-education, Patient/Family education, Self Care, Joint mobilization, Aquatic Therapy, Dry Needling, Spinal mobilization, Cryotherapy, Moist heat, Taping, and Biofeedback  PLAN FOR NEXT SESSION: core and hip strengthening, thoracic stretching and mobility, hip stretching, body mechanics for functional strengthening      Stacy Gardner, PT, DPT 12/18/235:06 PM   PHYSICAL THERAPY DISCHARGE  SUMMARY  Visits from Start of Care: 4  Current functional level related to goals / functional outcomes: See above goals   Remaining deficits: See above   Education / Equipment: HEP   Patient agrees to discharge. Patient goals were partially met. Patient is being discharged due to the patient's request. Pt reports feeling better at this time  Gustavus Bryant, PT, DPT 04/24/22 10:08 AM

## 2022-04-03 NOTE — L&D Delivery Note (Signed)
Delivery Note Pushed for over 2 hours with steady progression but intermittent variable decels which were relieved with position changes.  Light to mod meconium fluid noted so NICU team present for birth  At 11:21 PM a viable and healthy female was delivered via Vaginal, Spontaneous (Presentation: Right Occiput Anterior).  APGAR: 7, 9; weight  .   Placenta status: Spontaneous, Intact.  Cord: 3 vessels with the following complications: None.  Cord pH: pending  Anesthesia: Epidural Episiotomy: None Lacerations: Bialteral Labial Suture Repair:  4-0 Monocryl Est. Blood Loss (mL): 69  Mom to postpartum.  Baby to Couplet care / Skin to Skin.  Hansel Feinstein 05/12/2022, 12:02 AM

## 2022-04-05 ENCOUNTER — Encounter: Payer: Self-pay | Admitting: Obstetrics & Gynecology

## 2022-04-05 ENCOUNTER — Ambulatory Visit (INDEPENDENT_AMBULATORY_CARE_PROVIDER_SITE_OTHER): Payer: No Typology Code available for payment source | Admitting: Obstetrics & Gynecology

## 2022-04-05 VITALS — BP 96/61 | HR 89 | Wt 142.0 lb

## 2022-04-05 DIAGNOSIS — Z3A34 34 weeks gestation of pregnancy: Secondary | ICD-10-CM

## 2022-04-05 DIAGNOSIS — Z3480 Encounter for supervision of other normal pregnancy, unspecified trimester: Secondary | ICD-10-CM

## 2022-04-05 DIAGNOSIS — Z3483 Encounter for supervision of other normal pregnancy, third trimester: Secondary | ICD-10-CM

## 2022-04-05 NOTE — Patient Instructions (Addendum)
Return to office for any scheduled appointments. Call the office or go to the MAU at [place] at [facility] if: You begin to have strong, frequent contractions Your water breaks.  Sometimes it is a big gush of fluid, sometimes it is just a trickle that keeps getting your underwear wet or running down your legs You have vaginal bleeding.  It is normal to have a small amount of spotting if your cervix was checked.  You do not feel your baby moving like normal.  If you do not, get something to eat and drink and lay down and focus on feeling your baby move.   If your baby is still not moving like normal, you should call the office or go to MAU. Any other obstetric concerns.   RSV Vaccination for Pregnant People  CDC recommends two ways to protect babies from getting very sick with Respiratory Syncytial Virus (RSV):  An RSV vaccination given during pregnancy  Pfizer's vaccine Evans Lance) is recommended for use during pregnancy. It is given during RSV season to people who are 32 through [redacted] weeks pregnant.  Or, An RSV immunization given directly to infants and some older babies  Babies born to mothers who get RSV vaccine at least 2 weeks before delivery will have protection and, in most cases, should not need an RSV immunization later.    When is RSV season?  In most regions of the [place] RSV season starts in the fall and peaks in the winter, but the timing and severity of RSV season can vary from place to place and year to year.   The goal of maternal RSV vaccination is to protect babies from getting very sick with RSV during their first RSV season.  In most of the [place], this means maternal RSV vaccine will be given in September through January.  Who should get the maternal RSV vaccine?  People who are 16 through [redacted] weeks pregnant during September through January should get one dose of maternal RSV vaccine to protect their babies. RSV season can  vary around the country.   How is the maternal RSV vaccine administered?  Maternal RSV vaccine is given as a shot into the mother's upper arm. Only a single dose (one shot) of maternal RSV vaccine is recommended.   It is not yet known whether another dose might be needed in later pregnancies.  How well does the maternal RSV vaccine work?  When someone gets RSV vaccine, their body responds by making a protein that protects against the virus that causes RSV. The process takes about 2 weeks. When a pregnant person gets RSV vaccine, their protective proteins (called antibodies) also pass to their baby. So, babies who are born at least 2 weeks after their mother gets RSV vaccine are protected at birth, when infants are at the highest risk of severe RSV disease.   The vaccine can reduce a baby's risk of being hospitalized from RSV by 57% in the first six months after birth.  What are the possible side effects of the maternal RSV vaccine?  In the clinical trials, the side effects most often reported by pregnant people who received the maternal RSV vaccine were pain at the injection site, headache, muscle pain, and nausea.  Although not common, a dangerous high blood pressure condition called pre-eclampsia occurred in 1.8% of pregnant people who received the maternal RSV vaccine compared to 1.4% of pregnant people who received a placebo.  The clinical trials identified  a small increase in the number of preterm births in vaccinated pregnant people. It is not clear if this is a true safety problem related to RSV vaccine or if this occurred for reasons unrelated to vaccination.  To reduce the potential risk of preterm birth and complications from RSV disease, FDA approved the maternal RSV vaccine for use during weeks 32 through 23 of pregnancy while additional studies are conducted.  FDA is requiring the manufacturer to do additional studies that will look more closely at the potential risk of preterm  births and pregnancy-related high blood pressure issues in mothers, including pre-eclampsia.  Severe allergic reactions to vaccines are rare but can happen after any vaccine and can be life-threatening. If you see signs of a severe allergic reaction after vaccination (hives, swelling of the face and throat, difficulty breathing, a fast heartbeat, dizziness, or weakness), seek immediate medical care by calling 911.  As with any medicine or vaccine there is a very remote chance of the vaccine causing other serious injury or death after vaccination.  Adverse events following vaccination should be reported to the Vaccine Adverse Event Reporting System (VAERS), even if it's not clear that the vaccine caused the adverse event. You or your doctor can report an adverse event to [facility] and FDA through VAERS. If you need further assistance reporting to VAERS, please email [email or web address] [email or web address] or call 3037954661.  If you have any questions about side effects from the maternal RSV vaccine, talk with your healthcare provider.  Do I need a prescription for a maternal RSV vaccine?  Until the vaccine available in the office, you will need a prescription to take to a local pharmacy that is providing the vaccine.   How do I pay for the maternal RSV vaccine?  Most private health insurance plans cover the maternal RSV vaccine, but there may be a cost to you depending on your plan.  Contact your insurer to find out.  Medicaid Beginning January 01, 2022, most people with coverage from [facility] and [facility]) will be guaranteed coverage of all vaccines recommended by the Advisory Committee on Immunization Practice at no cost to them.   Source: [facility] for Immunization and Respiratory Diseases

## 2022-04-05 NOTE — Progress Notes (Signed)
   PRENATAL VISIT NOTE  Subjective:  Joyce Stewart is a 32 y.o. G3P0020 at [redacted]w[redacted]d being seen today for ongoing prenatal care.  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  Patient reports no complaints.  Contractions: Not present. Vag. Bleeding: None.  Movement: Present. Denies leaking of fluid.   The following portions of the patient's history were reviewed and updated as appropriate: allergies, current medications, past family history, past medical history, past social history, past surgical history and problem list.   Objective:   Vitals:   04/05/22 0833  BP: 96/61  Pulse: 89  Weight: 142 lb (64.4 kg)    Fetal Status: Fetal Heart Rate (bpm): 140 Fundal Height: 34 cm Movement: Present     General:  Alert, oriented and cooperative. Patient is in no acute distress.  Skin: Skin is warm and dry. No rash noted.   Cardiovascular: Normal heart rate noted  Respiratory: Normal respiratory effort, no problems with respiration noted  Abdomen: Soft, gravid, appropriate for gestational age.  Pain/Pressure: Absent     Pelvic: Cervical exam deferred        Extremities: Normal range of motion.  Edema: None  Mental Status: Normal mood and affect. Normal behavior. Normal judgment and thought content.   Assessment and Plan:  Pregnancy: G3P0020 at [redacted]w[redacted]d 1. [redacted] weeks gestation of pregnancy 2. Supervision of other normal pregnancy, antepartum Had long discussion about postpartum contraception, considering Paragard vs NFP/Phexxi. Also discussed birth plan (very reasonable, things we already do at [facility]) and all questions answered.  Will get RSV vaccine soon.  Preterm labor symptoms and general obstetric precautions including but not limited to vaginal bleeding, contractions, leaking of fluid and fetal movement were reviewed in detail with the  patient. Please refer to After Visit Summary for other counseling recommendations.   Return in about 2 weeks (around 04/19/2022) for OFFICE OB VISIT (MD or APP), Pelvic cultures.  Future Appointments  Date Time Provider [place]  04/20/2022  2:50 PM Truett Mainland, DO [facility] None  04/24/2022  3:30 PM Desenglau, Tommy Rainwater, PT [facility] None  04/27/2022  4:10 PM Truett Mainland, DO [facility] None  05/04/2022  4:10 PM Truett Mainland, DO [facility] None  05/11/2022  1:50 PM Truett Mainland, DO [facility] None  05/17/2022  8:35 AM Nehemiah Settle, Tanna Savoy, DO [facility] None    Verita Schneiders, MD

## 2022-04-19 DIAGNOSIS — F4320 Adjustment disorder, unspecified: Secondary | ICD-10-CM | POA: Diagnosis not present

## 2022-04-20 ENCOUNTER — Ambulatory Visit (INDEPENDENT_AMBULATORY_CARE_PROVIDER_SITE_OTHER): Payer: No Typology Code available for payment source | Admitting: Family Medicine

## 2022-04-20 ENCOUNTER — Other Ambulatory Visit (HOSPITAL_COMMUNITY)
Admission: RE | Admit: 2022-04-20 | Discharge: 2022-04-20 | Disposition: A | Discharge status: Home / Self Care | Payer: No Typology Code available for payment source | Source: Ambulatory Visit | Attending: Family Medicine | Admitting: Family Medicine

## 2022-04-20 VITALS — BP 113/71 | HR 72 | Wt 144.0 lb

## 2022-04-20 DIAGNOSIS — Z3A36 36 weeks gestation of pregnancy: Secondary | ICD-10-CM | POA: Insufficient documentation

## 2022-04-20 DIAGNOSIS — Z3480 Encounter for supervision of other normal pregnancy, unspecified trimester: Secondary | ICD-10-CM | POA: Diagnosis not present

## 2022-04-20 DIAGNOSIS — Z3483 Encounter for supervision of other normal pregnancy, third trimester: Secondary | ICD-10-CM

## 2022-04-20 NOTE — Progress Notes (Signed)
   PRENATAL VISIT NOTE  Subjective:  Joyce Stewart is a 32 y.o. G3P0020 at [redacted]w[redacted]d being seen today for ongoing prenatal care.  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  Patient reports no complaints.  Contractions: Not present. Vag. Bleeding: None.  Movement: Present. Denies leaking of fluid.   The following portions of the patient's history were reviewed and updated as appropriate: allergies, current medications, past family history, past medical history, past social history, past surgical history and problem list.   Objective:   Vitals:   04/20/22 1503  BP: 113/71  Pulse: 72  Weight: 144 lb (65.3 kg)    Fetal Status: Fetal Heart Rate (bpm): 150 Fundal Height: 36 cm Movement: Present  Presentation: Vertex  General:  Alert, oriented and cooperative. Patient is in no acute distress.  Skin: Skin is warm and dry. No rash noted.   Cardiovascular: Normal heart rate noted  Respiratory: Normal respiratory effort, no problems with respiration noted  Abdomen: Soft, gravid, appropriate for gestational age.  Pain/Pressure: Present (feeling more downward pressure)     Pelvic: Cervical exam deferred        Extremities: Normal range of motion.  Edema: Trace  Mental Status: Normal mood and affect. Normal behavior. Normal judgment and thought content.   Assessment and Plan:  Pregnancy: G3P0020 at [redacted]w[redacted]d 1. [redacted] weeks gestation of pregnancy - Culture, beta strep (group b only) - GC/Chlamydia probe amp ([place])not at [facility]  2. Supervision of other normal pregnancy, antepartum FHT and FH normal Wishes to remain mobile during labor. No episiotomy. Saline lock. Low intervention if possible. Recommended [employer]. Did get RSV vaccine. - Culture, beta strep (group b only) - GC/Chlamydia probe amp ([place])not at  [facility]  Preterm labor symptoms and general obstetric precautions including but not limited to vaginal bleeding, contractions, leaking of fluid and fetal movement were reviewed in detail with the patient. Please refer to After Visit Summary for other counseling recommendations.   No follow-ups on file.  Future Appointments  Date Time Provider [place]  04/24/2022  3:30 PM Desenglau, Tommy Rainwater, PT [facility] None  04/27/2022  4:10 PM Truett Mainland, DO [facility] None  05/04/2022  4:10 PM Truett Mainland, DO [facility] None  05/11/2022  1:50 PM Truett Mainland, DO [facility] None  05/17/2022  8:35 AM Nehemiah Settle Tanna Savoy, DO [facility] None    Truett Mainland, DO

## 2022-04-24 ENCOUNTER — Encounter: Payer: BC Managed Care – PPO | Admitting: Physical Therapy

## 2022-04-24 ENCOUNTER — Encounter: Payer: Self-pay | Admitting: Family Medicine

## 2022-04-24 ENCOUNTER — Ambulatory Visit: Payer: Self-pay | Admitting: Physical Therapy

## 2022-04-24 DIAGNOSIS — R8271 Bacteriuria: Secondary | ICD-10-CM | POA: Insufficient documentation

## 2022-04-24 DIAGNOSIS — O99820 Streptococcus B carrier state complicating pregnancy: Secondary | ICD-10-CM | POA: Insufficient documentation

## 2022-04-24 LAB — GC/CHLAMYDIA PROBE AMP (~~LOC~~) NOT AT ARMC
Chlamydia: NEGATIVE
Comment: NEGATIVE
Comment: NORMAL
Neisseria Gonorrhea: NEGATIVE

## 2022-04-24 LAB — CULTURE, BETA STREP (GROUP B ONLY): Strep Gp B Culture: POSITIVE — AB

## 2022-04-27 ENCOUNTER — Ambulatory Visit: Payer: No Typology Code available for payment source | Admitting: Family Medicine

## 2022-04-27 VITALS — BP 98/62 | HR 77 | Wt 145.0 lb

## 2022-04-27 DIAGNOSIS — Z3480 Encounter for supervision of other normal pregnancy, unspecified trimester: Secondary | ICD-10-CM

## 2022-04-27 DIAGNOSIS — Z3A37 37 weeks gestation of pregnancy: Secondary | ICD-10-CM

## 2022-04-27 DIAGNOSIS — Z3483 Encounter for supervision of other normal pregnancy, third trimester: Secondary | ICD-10-CM

## 2022-04-27 NOTE — Progress Notes (Signed)
   PRENATAL VISIT NOTE  Subjective:  Joyce Stewart is a 32 y.o. G3P0020 at [redacted]w[redacted]d being seen today for ongoing prenatal care.  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  Patient reports no complaints.  Contractions: Not present. Vag. Bleeding: None.  Movement: Present. Denies leaking of fluid.   The following portions of the patient's history were reviewed and updated as appropriate: allergies, current medications, past family history, past medical history, past social history, past surgical history and problem list.   Objective:   Vitals:   04/27/22 1613  BP: 98/62  Pulse: 77  Weight: 145 lb (65.8 kg)    Fetal Status: Fetal Heart Rate (bpm): 135   Movement: Present     General:  Alert, oriented and cooperative. Patient is in no acute distress.  Skin: Skin is warm and dry. No rash noted.   Cardiovascular: Normal heart rate noted  Respiratory: Normal respiratory effort, no problems with respiration noted  Abdomen: Soft, gravid, appropriate for gestational age.  Pain/Pressure: Absent     Pelvic: Cervical exam deferred        Extremities: Normal range of motion.  Edema: None  Mental Status: Normal mood and affect. Normal behavior. Normal judgment and thought content.   Assessment and Plan:  Pregnancy: [id number] at [redacted]w[redacted]d 1. Supervision of other normal pregnancy, antepartum FHT and FH normal Phexxi or Paragard IUD for contraception [facility]  Preterm labor symptoms and general obstetric precautions including but not limited to vaginal bleeding, contractions, leaking of fluid and fetal movement were reviewed in detail with the patient. Please refer to After Visit Summary for other counseling recommendations.   No follow-ups on file.  Future  Appointments  Date Time Provider [place]  05/04/2022  4:10 PM Truett Mainland, DO [facility] None  05/11/2022  1:50 PM Truett Mainland, DO [facility] None  05/17/2022  8:35 AM Truett Mainland, DO [facility] None    Truett Mainland, DO

## 2022-05-04 ENCOUNTER — Ambulatory Visit: Payer: No Typology Code available for payment source | Admitting: Family Medicine

## 2022-05-04 VITALS — BP 118/76 | HR 76 | Wt 145.0 lb

## 2022-05-04 DIAGNOSIS — R87612 Low grade squamous intraepithelial lesion on cytologic smear of cervix (LGSIL): Secondary | ICD-10-CM

## 2022-05-04 DIAGNOSIS — Z3480 Encounter for supervision of other normal pregnancy, unspecified trimester: Secondary | ICD-10-CM

## 2022-05-04 DIAGNOSIS — O99820 Streptococcus B carrier state complicating pregnancy: Secondary | ICD-10-CM

## 2022-05-04 NOTE — Progress Notes (Signed)
   PRENATAL VISIT NOTE  Subjective:  Joyce Stewart is a 32 y.o. G3P0020 at [redacted]w[redacted]d being seen today for ongoing prenatal care.  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  Patient reports no complaints.  Contractions: Not present. Vag. Bleeding: None.  Movement: Present. Denies leaking of fluid.   The following portions of the patient's history were reviewed and updated as appropriate: allergies, current medications, past family history, past medical history, past social history, past surgical history and problem list.   Objective:   Vitals:   05/04/22 1613  BP: 118/76  Pulse: 76  Weight: 145 lb (65.8 kg)    Fetal Status:     Movement: Present     General:  Alert, oriented and cooperative. Patient is in no acute distress.  Skin: Skin is warm and dry. No rash noted.   Cardiovascular: Normal heart rate noted  Respiratory: Normal respiratory effort, no problems with respiration noted  Abdomen: Soft, gravid, appropriate for gestational age.  Pain/Pressure: Present (dull lower back aches)     Pelvic: Cervical exam deferred        Extremities: Normal range of motion.  Edema: None  Mental Status: Normal mood and affect. Normal behavior. Normal judgment and thought content.   Assessment and Plan:  Pregnancy: [id number] at [redacted]w[redacted]d 1. Supervision of other normal pregnancy, antepartum FHT and FH normal  2. 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 GBS (group B Streptococcus carrier), +RV culture, currently pregnant Intrapartum ppx.  Preterm labor symptoms and general obstetric precautions including but not limited to vaginal bleeding, contractions, leaking of fluid and fetal movement were reviewed in detail with the patient. Please refer to  After Visit Summary for other counseling recommendations.   No follow-ups on file.  Future Appointments  Date Time Provider [place]  05/11/2022  1:50 PM Truett Mainland, DO [facility] None  05/17/2022  8:35 AM Truett Mainland, DO [facility] None    Truett Mainland, DO

## 2022-05-11 ENCOUNTER — Encounter (HOSPITAL_COMMUNITY): Payer: Self-pay | Admitting: Obstetrics and Gynecology

## 2022-05-11 ENCOUNTER — Other Ambulatory Visit: Payer: Self-pay

## 2022-05-11 ENCOUNTER — Inpatient Hospital Stay (HOSPITAL_COMMUNITY)
Admission: AD | Admit: 2022-05-11 | Discharge: 2022-05-13 | DRG: 807 | Disposition: A | Discharge status: Home / Self Care | Payer: No Typology Code available for payment source | Attending: Family Medicine | Admitting: Family Medicine

## 2022-05-11 ENCOUNTER — Inpatient Hospital Stay (HOSPITAL_COMMUNITY): Payer: No Typology Code available for payment source | Admitting: Anesthesiology

## 2022-05-11 ENCOUNTER — Encounter: Payer: No Typology Code available for payment source | Admitting: Family Medicine

## 2022-05-11 DIAGNOSIS — Z23 Encounter for immunization: Secondary | ICD-10-CM

## 2022-05-11 DIAGNOSIS — O99824 Streptococcus B carrier state complicating childbirth: Principal | ICD-10-CM | POA: Diagnosis present

## 2022-05-11 DIAGNOSIS — Z3480 Encounter for supervision of other normal pregnancy, unspecified trimester: Principal | ICD-10-CM

## 2022-05-11 DIAGNOSIS — Z3A39 39 weeks gestation of pregnancy: Secondary | ICD-10-CM

## 2022-05-11 DIAGNOSIS — O26893 Other specified pregnancy related conditions, third trimester: Secondary | ICD-10-CM | POA: Diagnosis present

## 2022-05-11 DIAGNOSIS — O99820 Streptococcus B carrier state complicating pregnancy: Secondary | ICD-10-CM | POA: Diagnosis not present

## 2022-05-11 LAB — CBC
HCT: 35.4 % — ABNORMAL LOW (ref 36.0–46.0)
Hemoglobin: 13 g/dL (ref 12.0–15.0)
MCH: 33.5 pg (ref 26.0–34.0)
MCHC: 36.7 g/dL — ABNORMAL HIGH (ref 30.0–36.0)
MCV: 91.2 fL (ref 80.0–100.0)
Platelets: 299 10*3/uL (ref 150–400)
RBC: 3.88 MIL/uL (ref 3.87–5.11)
RDW: 12.2 % (ref 11.5–15.5)
WBC: 14.9 10*3/uL — ABNORMAL HIGH (ref 4.0–10.5)
nRBC: 0 % (ref 0.0–0.2)

## 2022-05-11 LAB — TYPE AND SCREEN
ABO/RH(D): A POS
Antibody Screen: NEGATIVE

## 2022-05-11 MED ORDER — ACETAMINOPHEN 325 MG PO TABS: 650.0000 mg | ORAL_TABLET | ORAL | Status: DC | PRN

## 2022-05-11 MED ORDER — PHENYLEPHRINE 80 MCG/ML (10ML) SYRINGE FOR IV PUSH (FOR BLOOD PRESSURE SUPPORT): 80.0000 ug | PREFILLED_SYRINGE | INTRAVENOUS | Status: DC | PRN

## 2022-05-11 MED ORDER — LACTATED RINGERS IV SOLN: 500.0000 mL | INTRAVENOUS | Status: DC | PRN

## 2022-05-11 MED ORDER — FENTANYL-BUPIVACAINE-NACL 0.5-0.125-0.9 MG/250ML-% EP SOLN
EPIDURAL | Status: AC
  Filled 2022-05-11: qty 250

## 2022-05-11 MED ORDER — ONDANSETRON HCL 4 MG/2ML IJ SOLN: 4.0000 mg | Freq: Four times a day (QID) | INTRAMUSCULAR | Status: DC | PRN

## 2022-05-11 MED ORDER — SODIUM CHLORIDE 0.9 % IV SOLN: 1.0000 g | INTRAVENOUS | Status: DC

## 2022-05-11 MED ORDER — EPHEDRINE 5 MG/ML INJ: 10.0000 mg | INTRAVENOUS | Status: DC | PRN

## 2022-05-11 MED ORDER — LIDOCAINE HCL (PF) 1 % IJ SOLN
INTRAMUSCULAR | Status: DC | PRN
  Administered 2022-05-11: 5 mL via EPIDURAL
  Administered 2022-05-11: 2 mL via EPIDURAL
  Administered 2022-05-11: 3 mL via EPIDURAL

## 2022-05-11 MED ORDER — LACTATED RINGERS IV SOLN: INTRAVENOUS | Status: DC

## 2022-05-11 MED ORDER — OXYTOCIN-SODIUM CHLORIDE 30-0.9 UT/500ML-% IV SOLN
2.5000 [IU]/h | INTRAVENOUS | Status: DC
  Administered 2022-05-11: 2.5 [IU]/h via INTRAVENOUS
  Filled 2022-05-11: qty 500

## 2022-05-11 MED ORDER — SOD CITRATE-CITRIC ACID 500-334 MG/5ML PO SOLN: 30.0000 mL | ORAL | Status: DC | PRN

## 2022-05-11 MED ORDER — FENTANYL-BUPIVACAINE-NACL 0.5-0.125-0.9 MG/250ML-% EP SOLN
12.0000 mL/h | EPIDURAL | Status: DC | PRN
  Administered 2022-05-11: 12 mL/h via EPIDURAL

## 2022-05-11 MED ORDER — SODIUM CHLORIDE 0.9 % IV SOLN: 2.0000 g | Freq: Once | INTRAVENOUS | Status: DC

## 2022-05-11 MED ORDER — LACTATED RINGERS IV SOLN: 500.0000 mL | Freq: Once | INTRAVENOUS | Status: DC

## 2022-05-11 MED ORDER — SODIUM CHLORIDE 0.9 % IV SOLN
1.0000 g | INTRAVENOUS | Status: DC
  Administered 2022-05-11 (×2): 1 g via INTRAVENOUS
  Filled 2022-05-11 (×2): qty 1000

## 2022-05-11 MED ORDER — SODIUM CHLORIDE 0.9 % IV SOLN
2.0000 g | Freq: Once | INTRAVENOUS | Status: AC
  Administered 2022-05-11: 2 g via INTRAVENOUS
  Filled 2022-05-11: qty 2000

## 2022-05-11 MED ORDER — DIPHENHYDRAMINE HCL 50 MG/ML IJ SOLN: 12.5000 mg | INTRAMUSCULAR | Status: DC | PRN

## 2022-05-11 MED ORDER — OXYTOCIN BOLUS FROM INFUSION
333.0000 mL | Freq: Once | INTRAVENOUS | Status: AC
  Administered 2022-05-11: 333 mL via INTRAVENOUS

## 2022-05-11 MED ORDER — FENTANYL CITRATE (PF) 100 MCG/2ML IJ SOLN: 50.0000 ug | INTRAMUSCULAR | Status: DC | PRN

## 2022-05-11 MED ORDER — LIDOCAINE HCL (PF) 1 % IJ SOLN
30.0000 mL | INTRAMUSCULAR | Status: DC | PRN
  Filled 2022-05-11: qty 30

## 2022-05-11 NOTE — Progress Notes (Signed)
Patient ID: Joyce Stewart, female   DOB: 10/22/1990, 32 y.o.   MRN: [id number] Comfortable with epidural Vitals:   05/11/22 1900 05/11/22 1930 05/11/22 2000 05/11/22 2030  BP: (!) 109/57 121/77 122/77 119/80  Pulse: 77 76 70 80  Resp:  18 16 16   SpO2:        FHR reassuring UCs regular  Dilation: 10 Dilation Complete Date: 05/11/22 Dilation Complete Time: 2030 Effacement (%): 90 Station: Plus 2 Presentation: Vertex Exam by:: Myrtice Lauth, RN  Will start pushing

## 2022-05-11 NOTE — Progress Notes (Signed)
  Subjective:  Pt comfortable with epidural Objective: BP 117/75   Pulse (!) 105   LMP 08/09/2021   SpO2 100%  No intake/output data recorded. No intake/output data recorded.  FHT:  FHR: 120 bpm, variability: moderate,  accelerations:  Present,  decelerations:  Absent UC:   regular, every 5 minutes SVE:  9/90/0 Labs: Lab Results  Component Value Date   WBC 14.9 (H) 05/11/2022   HGB 13.0 05/11/2022   HCT 35.4 (L) 05/11/2022   MCV 91.2 05/11/2022   PLT 299 05/11/2022    Assessment / Plan: Spontaneous labor, progressing normally  Labor: Progressing normally AROMed with mec stained fluid  Fetal Wellbeing:  Category I Pain Control:  Epidural I/D:   GBS + Anticipated MOD:  NSVD  Abram Sander, MD 05/11/2022, 5:34 PM

## 2022-05-11 NOTE — Progress Notes (Signed)
error 

## 2022-05-11 NOTE — Anesthesia Preprocedure Evaluation (Signed)
Anesthesia Evaluation  Patient identified by MRN, date of birth, ID band Patient awake    Reviewed: Allergy & Precautions, Patient's Chart, lab work & pertinent test results  Airway Mallampati: II  TM Distance: >3 FB     Dental   Pulmonary neg pulmonary ROS   breath sounds clear to auscultation       Cardiovascular negative cardio ROS  Rhythm:Regular Rate:Normal     Neuro/Psych negative neurological ROS     GI/Hepatic negative GI ROS, Neg liver ROS,,,  Endo/Other  negative endocrine ROS    Renal/GU negative Renal ROS     Musculoskeletal   Abdominal   Peds  Hematology negative hematology ROS (+)   Anesthesia Other Findings   Reproductive/Obstetrics (+) Pregnancy                             Anesthesia Physical Anesthesia Plan  ASA: 2  Anesthesia Plan: Epidural   Post-op Pain Management:    Induction:   PONV Risk Score and Plan: 2  Airway Management Planned: Natural Airway  Additional Equipment:   Intra-op Plan:   Post-operative Plan:   Informed Consent: I have reviewed the patients History and Physical, chart, labs and discussed the procedure including the risks, benefits and alternatives for the proposed anesthesia with the patient or authorized representative who has indicated his/her understanding and acceptance.       Plan Discussed with:   Anesthesia Plan Comments:        Anesthesia Quick Evaluation

## 2022-05-11 NOTE — MAU Note (Signed)
Joyce Stewart is a 32 y.o. at [redacted]w[redacted]d here in MAU reporting: contractions started last night, they are every 4 min. No bleeding or LOF. +FM  Onset of complaint: last night  Pain score: 4/10  FHT: Efm applied in room  Lab orders placed from triage: none

## 2022-05-11 NOTE — H&P (Addendum)
OBSTETRIC ADMISSION HISTORY AND PHYSICAL  Joyce Stewart is a 32 y.o. female G3P0020 with IUP at 83w2dby LMP presenting for SOL. She reports +FMs, No LOF, no VB, no blurry vision, headaches or peripheral edema, and RUQ pain.  She plans on breast feeding. She request  for birth control. [facility]   Dating: By LMP --->  Estimated Date of Delivery: 05/16/22  [place] 12/26/21:    @[redacted]w[redacted]d$ , CWD, normal anatomy, cephalic presentation, [id number] 52% EFW   Prenatal History/Complications: +GBS LGSIL         Nursing Staff Provider  Office Location   HP Dating   [facility] [place] [facility]  Traditional [ ]$  Centering [ ]$  Mom-Baby Dyad      Language   Eng[place]ih Anatomy [place] Normal, female  Flu Vaccine  Declines  Genetic/Carrier Screen  NIPS:   LR Female AFP:   neg Horizon:  TDaP Vaccine  02/28/22 Hgb A1C or  GTT Early  Third trimester   COVID Vaccine     LAB RESULTS   Rhogam   n/a Blood Type A/Positive/-- (07/25 1100)   Baby Feeding Plan  Breast Antibody Negative (07/25 1100)  Contraception NFP/Phexxi vs Paragard Rubella <0.90 (07/25 1100)  Circumcision n/a RPR Non Reactive (07/25 1100)   Pediatrician    HBsAg Negative (07/25 1100)   Support Person  JVonna KotykHCVAb    Prenatal Classes   HIV Non Reactive (07/25 1100)     BTL Consent   GBS (For PCN allergy, check sensitivities)   VBAC Consent   Pap  LSIL  [ ]$  Colpo           DME Rx [ ]$  BP cuff [ ]$  Weight Scale Waterbirth  [ ]$  Class [ ]$  Consent [ ]$  CNM visit  PHQ9 & GAD7 [  ] new OB [  ] 28 weeks  [  ] 36 weeks Induction  [ ]$  Orders Entered [ ]$ Foley Y/N       Past Medical History: Past Medical History:  Diagnosis Date   Chronic constipation 10/18/2017   Tendonitis of finger 05/15/2019   L thumb   Vaginal Pap smear, abnormal     Past Surgical History: History reviewed. No pertinent surgical history.  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  Tobacco Use   Smoking status: Never   Smokeless tobacco: Never  Vaping Use   Vaping Use: Never used  Substance and Sexual Activity   Alcohol use: Yes    Alcohol/week: 7.0 standard drinks of alcohol    Types: 7 Glasses of wine per week   Drug use: Never   Sexual activity: Yes  Other  Topics Concern   Not on file  Social History Narrative   Not on file   Social Determinants of Health   Financial Resource Strain: Not on file  Food Insecurity: Not on file  Transportation Needs: Not on file  Physical Activity: Not on file  Stress: Not on file  Social Connections: Not on file    Family History: Family History  Problem Relation Age of Onset   Cancer Father        prostate   Diabetes Neg Hx    Hyperlipidemia Neg Hx     Allergies: No Known Allergies  Medications Prior to Admission  Medication Sig Dispense Refill Last Dose   aspirin 81 MG chewable tablet Chew by mouth. (Patient not taking: Reported on 04/20/2022)      Cholecalciferol (VITAMIN D) 10 MCG/ML LIQD       Omega 3 1200 MG CAPS Take by mouth.      Prenatal Vit-Fe Fumarate-FA (MULTIVITAMIN-PRENATAL) 27-0.8 MG TABS tablet Take 1 tablet by mouth daily at 12 noon.        Review of Systems   All systems reviewed and negative except as stated in HPI  Blood pressure (!) 148/63, pulse 92, last menstrual period 08/09/2021. General appearance: alert and cooperative Lungs: clear to auscultation bilaterally Heart: regular rate and rhythm Abdomen: soft, non-tender; bowel sounds normal Extremities: Homans sign is  negative, no sign of DVT Presentation: cephalic Fetal monitoringBaseline: 130 bpm, Variability: Good {> 6 bpm), Accelerations: Reactive, and Decelerations: Absent Uterine activityFrequency: Every 4 minutes Dilation: 7.5 Effacement (%): 90 Station: -1, 0 Exam by:: k cowher rn   Prenatal labs: ABO, Rh: --/--/PENDING (02/08 1233) Antibody: PENDING (02/08 1233) Rubella: <0.90 (07/25 1100) RPR: Non Reactive (11/28 0932)  HBsAg: Negative (07/25 1100)  HIV: Non Reactive (11/28 0932)  GBS: Positive/-- (01/18 1537)   Genetic screening  low risk Anatomy US nml  Prenatal Transfer Tool  Maternal Diabetes: No Genetic Screening: Normal Maternal Ultrasounds/Referrals: Normal Fetal Ultrasounds or other Referrals:  None Maternal Substance Abuse:  No Significant Maternal Medications:  None Significant Maternal Lab Results:  Group B Strep positive Number of Prenatal Visits:greater than 3 verified prenatal visits Other Comments:  None  Results for orders placed or performed during the hospital encounter of 05/11/22 (from the past 24 hour(s))  Type and screen [place]   Collection Time: 05/11/22 12:33 PM  Result Value Ref Range   ABO/RH(D) PENDING    Antibody Screen PENDING    Sample Expiration      05/14/2022,2359 Performed at [facility], [address] [place], [place] [address]   CBC   Collection Time: 05/11/22 12:53 PM  Result Value Ref Range   WBC 14.9 (H) 4.0 - 10.5 K/uL   RBC 3.88 3.87 - 5.11 MIL/uL   Hemoglobin 13.0 12.0 - 15.0 g/dL   HCT 35.4 (L) 36.0 - 46.0 %   MCV 91.2 80.0 - 100.0 fL   MCH 33.5 26.0 - 34.0 pg   MCHC 36.7 (H) 30.0 - 36.0 g/dL   RDW 12.2 11.5 - 15.5 %   Platelets 299 150 - 400 K/uL   nRBC 0.0 0.0 - 0.2 %    Patient Active Problem List   Diagnosis Date Noted   Indication for care or intervention in labor or delivery 05/11/2022   GBS (group B Streptococcus carrier), +RV culture, currently pregnant 04/24/2022   LGSIL on Pap  smear of cervix on 10/25/21 12/26/2021   Supervision of other  normal pregnancy, antepartum 11/22/2021    Assessment/Plan:  Joyce Stewart is a 32 y.o. G3P0020 at 76w2dhere for SOL  #Labor:expectant management at this time, after 4 hours of abx will consider augmentation with AROM vs pit #Pain: Epidural (placed) #FWB: Cat I #ID:  GBS neg #MOF: breast #MOC: phexxi   HAbram Sander MD  05/11/2022, 12:56 PM  GME ATTESTATION:  I saw and evaluated the patient. I agree with the findings and the plan of care as documented in the resident's note. I have made changes to documentation as necessary.  SGerlene Fee DO OB Fellow, [place]for [place]2/11/2022, 3:54 PM

## 2022-05-11 NOTE — H&P (Signed)
Joyce Stewart is a 32 y.o. female, G3P0020 at 39.2 weeks, presenting for active labor. Patient receives care at [facility] and was supervised for a low-risk pregnancy. Pregnancy and medical history significant for problems as listed below. She is GBS positive and expresses a desire for epidural for pain management.  She is anticipating a female infant and requests non-hormonal option for PP birth control method.  She plans to breastfeed.    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 Stinson, DO.  BP: 118/76  Pulse: 76  Weight: 145 lb (65.8 kg)      Nursing Staff Provider  Office Location   HP Dating   Early [place]  [facility] Model [x]$  Traditional [ ]$  Centering [ ]$  Mom-Baby Dyad    Language   Englsih Anatomy US  Normal, female  Flu Vaccine  Declines  Genetic/Carrier Screen  NIPS:   LR Female AFP:   neg Horizon:  TDaP Vaccine  02/28/22 Hgb A1C or  GTT Early  Third trimester   COVID Vaccine    LAB RESULTS   Rhogam   n/a Blood Type A/Positive/-- (07/25 1100)   Baby Feeding Plan  Breast Antibody Negative (07/25 1100)  Contraception NFP/Phexxi vs Paragard Rubella <0.90 (07/25 1100)  Circumcision n/a RPR Non Reactive (07/25 1100)   Pediatrician   HBsAg Negative (07/25 1100)   Support Person  Vonna Kotyk HCVAb   Prenatal Classes  HIV Non Reactive (07/25 1100)     BTL Consent  GBS   (For PCN allergy, check sensitivities)   VBAC Consent  Pap  LSIL  [ ]$  Colpo       DME Rx [ ]$  BP cuff [ ]$  Weight Scale Waterbirth  [ ]$  Class [ ]$  Consent [ ]$  CNM visit  PHQ9 & GAD7 [  ] new OB [  ] 28 weeks  [  ] 36 weeks Induction  [ ]$  Orders Entered [ ]$ Foley Y/N     OB History     Gravida  3   Para      Term      Preterm       AB  2   Living         SAB  1   IAB  1   Ectopic      Multiple      Live Births                Past Medical History:  Diagnosis Date   Chronic constipation 10/18/2017   Tendonitis of finger 05/15/2019   L thumb   Vaginal Pap smear, abnormal    History reviewed. No pertinent surgical history. Family History: family history includes Cancer in her father. Social History:  reports that she has never smoked. She has never used smokeless tobacco. She reports current alcohol use of about 7.0 standard drinks of alcohol per week. She reports that she does not use drugs.   Prenatal Transfer Tool  Maternal Diabetes: No Genetic Screening: Normal Maternal Ultrasounds/Referrals: Normal Fetal Ultrasounds or other Referrals:  None Maternal Substance Abuse:  No Significant Maternal Medications:  None Significant Maternal Lab Results: Group B Strep positive   Maternal Assessment:  ROS: +Contractions, -LOF, -Vaginal Bleeding, +Fetal  Movement  All other systems reviewed and negative.    No Known Allergies   Dilation: 7.5 Effacement (%): 90 Station: -1, 0 Exam by:: k cowher rn Last menstrual period 08/09/2021.  Physical Exam Vitals reviewed.  Constitutional:      Appearance: Normal appearance.  HENT:     Head: Normocephalic and atraumatic.  Eyes:     Conjunctiva/sclera: Conjunctivae normal.  Cardiovascular:     Rate and Rhythm: Normal rate.  Pulmonary:     Effort: Pulmonary effort is normal. No respiratory distress.  Abdominal:     General: Bowel sounds are normal.  Musculoskeletal:        General: Normal range of motion.     Cervical back: Normal range of motion.  Skin:    General: Skin is warm and dry.  Neurological:     Mental Status: She is alert and oriented to person, place, and time.  Psychiatric:        Mood and Affect: Mood normal.        Behavior: Behavior normal.     Fetal Assessment: Leopolds: -Pelvis: Adequate -EFW:  ~7lbs -Presentation: Vertex by Leopolds  FHR: 135 bpm, Mod Var, -Decels, +Accels UCs:  Irregular    Assessment IUP at 39.2 weeks Cat I FT Active Labor GBS Positive  Plan: Admit to [facility]  Routine Labor and Delivery Orders per Protocol In room to complete assessment and discuss POC: Patient s/p epidural and coping well. Ampicillin ordered for GBS Plan to place foley bulb as patient reports no urination since this morning. L&D team to be updated on patient status  Loann Quill, MSN 05/11/2022, 12:28 PM

## 2022-05-11 NOTE — Anesthesia Procedure Notes (Signed)
Epidural Patient location during procedure: OB Start time: 05/11/2022 1:12 PM End time: 05/11/2022 1:18 PM  Staffing Anesthesiologist: Suzette Battiest, MD Performed: anesthesiologist   Preanesthetic Checklist Completed: patient identified, IV checked, site marked, risks and benefits discussed, surgical consent, monitors and equipment checked, pre-op evaluation and timeout performed  Epidural Patient position: sitting Prep: DuraPrep and site prepped and draped Patient monitoring: continuous pulse ox and blood pressure Approach: midline Location: L4-L5 Injection technique: LOR air  Needle:  Needle type: Tuohy  Needle gauge: 17 G Needle length: 9 cm and 9 Needle insertion depth: 6 cm Catheter type: closed end flexible Catheter size: 19 Gauge Catheter at skin depth: 11 cm Test dose: negative  Assessment Events: blood not aspirated, no cerebrospinal fluid, injection not painful, no injection resistance, no paresthesia and negative IV test

## 2022-05-12 ENCOUNTER — Encounter (HOSPITAL_COMMUNITY): Payer: Self-pay | Admitting: Family Medicine

## 2022-05-12 LAB — RPR: RPR Ser Ql: NONREACTIVE

## 2022-05-12 MED ORDER — ONDANSETRON HCL 4 MG PO TABS: 4.0000 mg | ORAL_TABLET | ORAL | Status: DC | PRN

## 2022-05-12 MED ORDER — TETANUS-DIPHTH-ACELL PERTUSSIS 5-2.5-18.5 LF-MCG/0.5 IM SUSY: 0.5000 mL | PREFILLED_SYRINGE | Freq: Once | INTRAMUSCULAR | Status: DC

## 2022-05-12 MED ORDER — SIMETHICONE 80 MG PO CHEW: 80.0000 mg | CHEWABLE_TABLET | ORAL | Status: DC | PRN

## 2022-05-12 MED ORDER — ZOLPIDEM TARTRATE 5 MG PO TABS: 5.0000 mg | ORAL_TABLET | Freq: Every evening | ORAL | Status: DC | PRN

## 2022-05-12 MED ORDER — ACETAMINOPHEN 325 MG PO TABS: 650.0000 mg | ORAL_TABLET | ORAL | Status: DC | PRN

## 2022-05-12 MED ORDER — WITCH HAZEL-GLYCERIN EX PADS: 1.0000 | MEDICATED_PAD | CUTANEOUS | Status: DC | PRN

## 2022-05-12 MED ORDER — BENZOCAINE-MENTHOL 20-0.5 % EX AERO
1.0000 | INHALATION_SPRAY | CUTANEOUS | Status: DC | PRN
  Filled 2022-05-12: qty 56

## 2022-05-12 MED ORDER — PRENATAL MULTIVITAMIN CH
1.0000 | ORAL_TABLET | Freq: Every day | ORAL | Status: DC
  Administered 2022-05-12: 1 via ORAL
  Filled 2022-05-12: qty 1

## 2022-05-12 MED ORDER — COCONUT OIL OIL: 1.0000 | TOPICAL_OIL | Status: DC | PRN

## 2022-05-12 MED ORDER — DIBUCAINE (PERIANAL) 1 % EX OINT: 1.0000 | TOPICAL_OINTMENT | CUTANEOUS | Status: DC | PRN

## 2022-05-12 MED ORDER — DIPHENHYDRAMINE HCL 25 MG PO CAPS: 25.0000 mg | ORAL_CAPSULE | Freq: Four times a day (QID) | ORAL | Status: DC | PRN

## 2022-05-12 MED ORDER — IBUPROFEN 600 MG PO TABS
600.0000 mg | ORAL_TABLET | Freq: Four times a day (QID) | ORAL | Status: DC
  Administered 2022-05-12 – 2022-05-13 (×5): 600 mg via ORAL
  Filled 2022-05-12 (×5): qty 1

## 2022-05-12 MED ORDER — SENNOSIDES-DOCUSATE SODIUM 8.6-50 MG PO TABS
2.0000 | ORAL_TABLET | ORAL | Status: DC
  Administered 2022-05-12 – 2022-05-13 (×2): 2 via ORAL
  Filled 2022-05-12 (×2): qty 2

## 2022-05-12 MED ORDER — ONDANSETRON HCL 4 MG/2ML IJ SOLN: 4.0000 mg | INTRAMUSCULAR | Status: DC | PRN

## 2022-05-12 NOTE — Progress Notes (Signed)
Post Partum Day 1 Subjective: no complaints, up ad lib, and voiding  Objective: Blood pressure 103/69, pulse 65, temperature 98.5 F (36.9 C), temperature source Oral, resp. rate 17, last menstrual period 08/09/2021, SpO2 98 %, unknown if currently breastfeeding.  Physical Exam:  General: cooperative and no distress Lochia: appropriate Uterine Fundus: firm Incision: n/a DVT Evaluation: No evidence of DVT seen on physical exam.  Recent Labs    05/11/22 1253  HGB 13.0  HCT 35.4*    Assessment/Plan: Plan for discharge tomorrow and Breastfeeding   LOS: 1 day   Hansel Feinstein, CNM 05/12/2022, 7:43 AM

## 2022-05-12 NOTE — Anesthesia Postprocedure Evaluation (Signed)
Anesthesia Post Note  Patient: Chelse Gunner  Procedure(s) Performed: AN AD HOC LABOR EPIDURAL     Patient location during evaluation: Mother Baby Anesthesia Type: Epidural Level of consciousness: awake, oriented and awake and alert Pain management: pain level controlled Vital Signs Assessment: post-procedure vital signs reviewed and stable Respiratory status: spontaneous breathing, respiratory function stable and nonlabored ventilation Cardiovascular status: stable Postop Assessment: no headache, adequate PO intake, able to ambulate, patient able to bend at knees and no apparent nausea or vomiting Anesthetic complications: no   No notable events documented.  Last Vitals:  Vitals:   05/12/22 0233 05/12/22 0552  BP: 106/66 103/69  Pulse: 70 65  Resp: 17 17  Temp: 36.7 C 36.9 C  SpO2: 99% 98%    Last Pain:  Vitals:   05/12/22 0728  TempSrc:   PainSc: 0-No pain   Pain Goal:                   Tully Mcinturff

## 2022-05-12 NOTE — Plan of Care (Signed)
  Problem: Education: Goal: Knowledge of General Education information will improve Description: Including pain rating scale, medication(s)/side effects and non-pharmacologic comfort measures Outcome: Completed/Met   Problem: Clinical Measurements: Goal: Ability to maintain clinical measurements within normal limits will improve Outcome: Completed/Met Goal: Diagnostic test results will improve Outcome: Completed/Met Goal: Respiratory complications will improve Outcome: Completed/Met Goal: Cardiovascular complication will be avoided Outcome: Completed/Met   Problem: Activity: Goal: Risk for activity intolerance will decrease Outcome: Completed/Met   Problem: Elimination: Goal: Will not experience complications related to bowel motility Outcome: Completed/Met Goal: Will not experience complications related to urinary retention Outcome: Completed/Met   Problem: Pain Managment: Goal: General experience of comfort will improve Outcome: Completed/Met   Problem: Safety: Goal: Ability to remain free from injury will improve Outcome: Completed/Met   Problem: Skin Integrity: Goal: Risk for impaired skin integrity will decrease Outcome: Completed/Met   Problem: Activity: Goal: Will verbalize the importance of balancing activity with adequate rest periods Outcome: Completed/Met Goal: Ability to tolerate increased activity will improve Outcome: Completed/Met   Problem: Life Cycle: Goal: Chance of risk for complications during the postpartum period will decrease Outcome: Completed/Met   Problem: Role Relationship: Goal: Ability to demonstrate positive interaction with newborn will improve Outcome: Completed/Met   Problem: Skin Integrity: Goal: Demonstration of wound healing without infection will improve Outcome: Completed/Met

## 2022-05-12 NOTE — Lactation Note (Signed)
This note was copied from a baby's chart. Lactation Consultation Note  Patient Name: Joyce Stewart [id number] Date: 05/12/2022 Reason for consult: Initial assessment;1st time breastfeeding;Term Age:31 hours  P1, Reviewed hand expression bilaterally with drops expressed.  Assisted with both cross cradle and football hold. Baby sustained latch for 15 min on R breast and then repositioned in football hold on L breast.  Demonstrated how to achieve a deep latch and flange bottom lip. Intermittent swallows observed.  Discussed cluster feeding and frequency.  Suggest calling for help as needed.   Maternal Data Has patient been taught Hand Expression?: Yes Does the patient have breastfeeding experience prior to this delivery?: No  Feeding Mother's Current Feeding Choice: Breast Milk  LATCH Score Latch: Grasps breast easily, tongue down, lips flanged, rhythmical sucking.  Audible Swallowing: A few with stimulation  Type of Nipple: Everted at rest and after stimulation  Comfort (Breast/Nipple): Soft / non-tender  Hold (Positioning): Assistance needed to correctly position infant at breast and maintain latch.  LATCH Score: 8  Interventions Interventions: Assisted with latch;Breast feeding basics reviewed;Skin to skin;Hand express;Adjust position;Support pillows;Position options;Education;LC Services brochure Consult Status Consult Status: Follow-up Date: 05/13/22 Follow-up type: In-patient    Joyce Stewart [facility] 05/12/2022, 9:56 AM

## 2022-05-12 NOTE — Discharge Summary (Signed)
     Postpartum Discharge Summary  Date of Service updated***     Patient Name: Joyce Stewart DOB: 10-Oct-1990 MRN: [id number]  Date of admission: 05/11/2022 Delivery date:05/11/2022  Delivering provider: Seabron Spates  Date of discharge: 05/12/2022  Admitting diagnosis: Indication for care or intervention in labor or delivery [O75.9] Intrauterine pregnancy: [redacted]w[redacted]d     Secondary diagnosis:  Principal Problem:   Indication for care or intervention in labor or delivery Active Problems:   Vaginal delivery  Additional problems: bilateral labial lacerations    Discharge diagnosis: Term Pregnancy Delivered                                              Post partum procedures:{Postpartum procedures:23558} Augmentation: N/A Complications: None  Hospital course: Onset of Labor With Vaginal Delivery      32 y.o. yo G3P0020 at [redacted]w[redacted]d was admitted in Active Labor on 05/11/2022. Labor course was complicated by***  Membrane Rupture Time/Date: 5:32 PM ,05/11/2022   Delivery Method:Vaginal, Spontaneous  Episiotomy: None  Lacerations:  Labial  Patient had a postpartum course complicated by ***.  She is ambulating, tolerating a regular diet, passing flatus, and urinating well. Patient is discharged home in stable condition on 05/12/22.  Newborn Data: Birth date:05/11/2022  Birth time:11:21 PM  Gender:Female  Living status:Living  Apgars:7 ,9  Weight:   Magnesium Sulfate received: No BMZ received: No Rhophylac:N/A MMR:No T-DaP:Given prenatally Flu: No Transfusion:No  Physical exam  Vitals:   05/11/22 2230 05/11/22 2330 05/11/22 2345 05/12/22 0000  BP: 119/68 108/62 104/74 105/89  Pulse: 66 78 78   Resp:   16 16  Temp:    98.7 F (37.1 C)  TempSrc:    Oral  SpO2:       General: {Exam; general:21111117} Lochia: {Desc; appropriate/inappropriate:30686::"appropriate"} Uterine Fundus: {Desc; firm/soft:30687} Incision: {Exam; incision:21111123} DVT Evaluation: {Exam;  dvt:2111122} Labs: Lab Results  Component Value Date   WBC 14.9 (H) 05/11/2022   HGB 13.0 05/11/2022   HCT 35.4 (L) 05/11/2022   MCV 91.2 05/11/2022   PLT 299 05/11/2022       No data to display         Edinburgh Score:     No data to display            After visit meds:  Allergies as of 05/12/2022   No Known Allergies   Med Rec must be completed prior to using this [facility]***        Discharge home in stable condition Infant Feeding: Breast Infant Disposition:{CHL IP OB HOME WITH HYQMVH:84696} Discharge instruction: per After Visit Summary and Postpartum booklet. Activity: Advance as tolerated. Pelvic rest for 6 weeks.  Diet: routine diet Anticipated Birth Control: {Birth Control:23956} Postpartum Appointment:{Outpatient follow up:23559} Additional Postpartum F/U: {PP Procedure:23957} Future Appointments: Future Appointments  Date Time Provider [place]  05/17/2022  8:35 AM Truett Mainland, DO [facility] None   Follow up Visit:      05/12/2022 Hansel Feinstein, CNM

## 2022-05-13 MED ORDER — MEASLES, MUMPS & RUBELLA VAC IJ SOLR
0.5000 mL | Freq: Once | INTRAMUSCULAR | Status: AC
  Administered 2022-05-13: 0.5 mL via SUBCUTANEOUS
  Filled 2022-05-13: qty 0.5

## 2022-05-13 MED ORDER — BENZOCAINE-MENTHOL 20-0.5 % EX AERO: 1.0000 | INHALATION_SPRAY | CUTANEOUS | 0 refills | Status: DC | PRN

## 2022-05-13 MED ORDER — ACETAMINOPHEN 325 MG PO TABS: 650.0000 mg | ORAL_TABLET | ORAL | 0 refills | Status: DC | PRN

## 2022-05-13 MED ORDER — IBUPROFEN 600 MG PO TABS: 600.0000 mg | ORAL_TABLET | Freq: Four times a day (QID) | ORAL | 0 refills | Status: DC

## 2022-05-13 NOTE — Lactation Note (Signed)
This note was copied from a baby's chart. Lactation Consultation Note  Patient Name: Joyce Stewart [id number] Date: 05/13/2022   Age:32 hours Per RN Rodena Medin - per mom , all good with breast feeding and don't need to see [place] prior to D/C.     Joyce Stewart 05/13/2022, 11:51 AM

## 2022-05-13 NOTE — Plan of Care (Signed)
  Problem: Coping: Goal: Ability to verbalize concerns and feelings about labor and delivery will improve Outcome: Completed/Met   Problem: Health Behavior/Discharge Planning: Goal: Ability to manage health-related needs will improve Outcome: Completed/Met   Problem: Clinical Measurements: Goal: Will remain free from infection Outcome: Completed/Met   Problem: Nutrition: Goal: Adequate nutrition will be maintained Outcome: Completed/Met   Problem: Coping: Goal: Level of anxiety will decrease Outcome: Completed/Met   Problem: Education: Goal: Knowledge of condition will improve Outcome: Completed/Met Goal: Individualized Educational Video(s) Outcome: Completed/Met Goal: Individualized Newborn Educational Video(s) Outcome: Completed/Met   Problem: Coping: Goal: Ability to identify and utilize available resources and services will improve Outcome: Completed/Met

## 2022-05-13 NOTE — Progress Notes (Signed)
Patient declined lactation services prior to discharge.

## 2022-05-17 ENCOUNTER — Encounter: Payer: No Typology Code available for payment source | Admitting: Family Medicine

## 2022-05-23 ENCOUNTER — Telehealth (HOSPITAL_COMMUNITY): Payer: Self-pay

## 2022-05-23 NOTE — Telephone Encounter (Signed)
Patient reports that she is doing pretty well. She states that her bleeding is light. RN reviewed bleeding and what to much bleeding looks like. Patient has questions about getting a referral for pelvic floor PT. RN told patient to talk to her OB about getting a referral. Patient declines any other questions/concerns about her health and healing.  Patient reports that baby is doing well. Patient states that they have been to [facility] appointment last week and have another appointment this Thursday. Baby sleeps in a bassinet. RN reviewed ABC's of safe sleep with patient. Patient declines any questions or concerns about baby.  EPDS score is 6.  Sharyn Lull [facility] 05/23/22,1541

## 2022-06-22 ENCOUNTER — Ambulatory Visit: Payer: No Typology Code available for payment source | Admitting: Family Medicine

## 2022-06-22 DIAGNOSIS — Z1339 Encounter for screening examination for other mental health and behavioral disorders: Secondary | ICD-10-CM | POA: Diagnosis not present

## 2022-06-22 DIAGNOSIS — N393 Stress incontinence (female) (male): Secondary | ICD-10-CM | POA: Diagnosis not present

## 2022-06-22 MED ORDER — PHEXXI 1.8-1-0.4 % VA GEL: 1.0000 | VAGINAL | 6 refills | Status: DC | PRN

## 2022-06-22 NOTE — Progress Notes (Signed)
[place] Partum Visit Note  Joyce Stewart is a 32 y.o. G28P1021 female who presents for a postpartum visit. She is 6 weeks postpartum following a normal spontaneous vaginal delivery.  I have fully reviewed the prenatal and intrapartum course. The delivery was at 39.2 gestational weeks.  Anesthesia: epidural. Postpartum course has been uneventful. Baby is doing well. Baby is feeding by breast. Bleeding no bleeding. Bowel function is normal. Bladder function is  having some urinary incontience . Patient is not sexually active. Contraception method is  Phexxi . Postpartum depression screening: negative. score5   The pregnancy intention screening data noted above was reviewed. Potential methods of contraception were discussed. The patient elected to proceed with No data recorded.   Edinburgh Postnatal Depression Scale - 06/22/22 1324       Edinburgh Postnatal Depression Scale:  In the Past 7 Days   I have been able to laugh and see the funny side of things. 0    I have looked forward with enjoyment to things. 0    I have blamed myself unnecessarily when things went wrong. 1    I have been anxious or worried for no good reason. 2    I have felt scared or panicky for no good reason. 1    Things have been getting on top of me. 1    I have been so unhappy that I have had difficulty sleeping. 0    I have felt sad or miserable. 0    I have been so unhappy that I have been crying. 0    The thought of harming myself has occurred to me. 0    Edinburgh Postnatal Depression Scale Total 5             Health Maintenance Due  Topic Date Due   COVID-19 Vaccine (5 - 2023-24 season) 12/02/2021    The following portions of the patient's history were reviewed and updated as appropriate: allergies, current medications, past family history, past medical history, past social history, past surgical history, and problem list.  Review of Systems Pertinent items are noted in HPI.  Objective:  BP (!)  104/56   Pulse 66   Ht 5\' 4"  (1.626 m)   Wt 126 lb (57.2 kg)   LMP 08/09/2021   Breastfeeding Yes   BMI 21.63 kg/m    General:  alert, cooperative, and no distress   Breasts:  not indicated  Lungs: clear to auscultation bilaterally  Heart:  regular rate and rhythm, S1, S2 normal, no murmur, click, rub or gallop  Abdomen: soft, non-tender; bowel sounds normal; no masses,  no organomegaly   Wound N/a  GU exam:  not indicated       Assessment:   1. Postpartum exam  2. Stress incontinence of urine Referred to pelvic floor PT - Ambulatory referral to Physical Therapy    Plan:   Essential components of care per ACOG recommendations:  1.  Mood and well being: Patient with negative depression screening today. Reviewed local resources for support.  - Patient tobacco use? No.   - hx of drug use? No.    2. Infant care and feeding:  -Patient currently breastmilk feeding? Yes. Reviewed importance of draining breast regularly to support lactation.  -Social determinants of health (SDOH) reviewed in EPIC. No concerns  3. Sexuality, contraception and birth spacing - Patient does not want a pregnancy in the next year.    - Reviewed reproductive life planning. Reviewed contraceptive methods based on  pt preferences and effectiveness.  Patient desired Spermicide (used alone) today.   - Discussed birth spacing of 18 months  4. Sleep and fatigue -Encouraged family/partner/community support of 4 hrs of uninterrupted sleep to help with mood and fatigue  5. Physical Recovery  - Discussed patients delivery and complications. She describes her labor as good. - Patient had a Vaginal, no problems at delivery. Patient had a  labial  laceration. Perineal healing reviewed. Patient expressed understanding - Patient has urinary incontinence? Yes. Discussed role of pelvic floor PT. - Patient is safe to resume physical and sexual activity  6.  Health Maintenance - HM due items addressed Yes - Last  pap smear  Diagnosis  Date Value Ref Range Status  10/25/2021 - Low grade squamous intraepithelial lesion (LSIL) (A)  Final   Pap smear not done at today's visit. - rpt PAP in July. -Breast Cancer screening indicated? No.   7. Chronic Disease/Pregnancy Condition follow up: None  - PCP follow up  [place] for [place]

## 2022-06-27 DIAGNOSIS — F411 Generalized anxiety disorder: Secondary | ICD-10-CM | POA: Diagnosis not present

## 2022-07-14 DIAGNOSIS — F411 Generalized anxiety disorder: Secondary | ICD-10-CM | POA: Diagnosis not present

## 2022-07-26 ENCOUNTER — Ambulatory Visit: Payer: No Typology Code available for payment source | Attending: Family Medicine | Admitting: Physical Therapy

## 2022-07-26 DIAGNOSIS — R293 Abnormal posture: Secondary | ICD-10-CM | POA: Diagnosis not present

## 2022-07-26 DIAGNOSIS — M6281 Muscle weakness (generalized): Secondary | ICD-10-CM

## 2022-07-26 DIAGNOSIS — R279 Unspecified lack of coordination: Secondary | ICD-10-CM | POA: Diagnosis not present

## 2022-07-26 DIAGNOSIS — N393 Stress incontinence (female) (male): Secondary | ICD-10-CM | POA: Insufficient documentation

## 2022-07-26 NOTE — Patient Instructions (Addendum)
Moisturizers They are used in the vagina to hydrate the mucous membrane that make up the vaginal canal. Designed to keep a more normal acid balance (ph) Once placed in the vagina, it will last between two to three days.  Use 2-3 times per week at bedtime  Ingredients to avoid is glycerin and fragrance, can increase chance of infection Should not be used just before sex due to causing irritation Most are gels administered either in a tampon-shaped applicator or as a vaginal suppository. They are non-hormonal.   Types of Moisturizers(internal use)  Vitamin E vaginal suppositories- Whole foods, Amazon Moist Again Coconut oil- can break down condoms Julva- (Do no use if on Tamoxifen) amazon Yes moisturizer- amazon NeuEve Silk , NeuEve Silver for menopausal or over 65 (if have severe vaginal atrophy or cancer treatments use NeuEve Silk for  1 month than move to NeuEve Silver)- Amazon, Neuve.com Olive and Bee intimate cream- www.oliveandbee.com.au Mae vaginal moisturizer- Amazon Aloe Good Clean Love Hyaluronic acid Hyalofemme replens   Creams to use externally on the Vulva area Desert Harvest Releveum (good for for cancer patients that had radiation to the area)- amazon or www.desertharvest.com V-magic cream - amazon Julva-amazon Vital "V Wild Yam salve ( help moisturize and help with thinning vulvar area, does have Beeswax MoodMaid Botanical Pro-Meno Wild Yam Cream- Amazon Desert Harvest Gele Cleo by Damiva labial moisturizer (Amazon,  Coconut or olive oil aloe Good Clean Love Enchanted Rose by intimate rose  Things to avoid in the vaginal area Do not use things to irritate the vulvar area No lotions just specialized creams for the vulva area- Neogyn, V-magic, No soaps; can use Aveeno or Calendula cleanser if needed. Must be gentle No deodorants No douches Good to sleep without underwear to let the vaginal area to air out No scrubbing: spread the lips to let warm water rinse  over labias and pat dry  Lubrication Used for intercourse to reduce friction Avoid ones that have glycerin, nonoxynol-9, petroleum, propylene glycol, chlorhexidine gluconate, warming gels, tingling gels, icing or cooling gel, scented Avoid parabens due to a preservative similar to female sex hormone May need to be reapplied once or several times during sexual activity Can be applied to both partners genitals prior to vaginal penetration to minimize friction or irritation Prevent irritation and mucosal tears that cause post coital pain and increased the risk of vaginal and urinary tract infections Oil-based lubricants cannot be used with condoms due to breaking them down.  Least likely to irritate vaginal tissue.  Plant based-lubes are safe Silicone-based lubrication are thicker and last long and used for post-menopausal women  Vaginal Lubricators Here is a list of some suggested lubricators you can use for intercourse. Use the most hypoallergenic product.  You can place on you or your partner.  Slippery Stuff ( water based) Sylk or Sliquid Natural H2O ( good  if frequent UTI's)- walmart, amazon Sliquid organics silk-(aloe and silicone based ) Blossom Organics (www.blossom-organics.com)- (aloe based ) Coconut oil, olive oil -not good with condoms  PJur Woman Nude- (water based) amazon Uberlube- ( silicon) Amazon Aloe Vera- Sprouts has an organic one Yes lubricant- (water based and has plant oil based similar to silicone) Amazon Wet Platinum-Silicone, Target, [facility] Olive and Bee intimate cream-  www.oliveandbee.com.au Pink - Amazon Wet stuff Erosense Sync- walmart, amazon Coconu- coconu.com Desert Harvest  Things to avoid in lubricants are glycerin, warming gels, tingling gels, icing or cooling  gels, and scented gels.  Also avoid Vaseline. KY jelly,    and Astroglide contain chlorhexidine which kills good bacteria(lactobacilli)  Things to avoid in the vaginal area Do not use  things to irritate the vulvar area No lotions- see below Soaps you  can use :Aveeno, Calendula, Good Clean Love cleanser if needed. Must be gentle No deodorants No douches Good to sleep without underwear to let the vaginal area to air out No scrubbing: spread the lips to let warm water rinse over labias and pat dry  Creams that can be used on the Vulva Area V magic-amazon, walmart Vital V Wild Yam Salve Julva- Amazon MoonMaid Botanical Pro-Meno Wild Yam Cream Coconut oil, olive oil Cleo by Damiva labial moisturizer -Amazon,  Desert Havest Releveum ( lidocaine) or Desert Harvest Gele Yes Moisturizer    Urge Incontinence  Ideal urination frequency is every 2-4 wakeful hours, which equates to 5-8 times within a 24-hour period.   Urge incontinence is leakage that occurs when the bladder muscle contracts, creating a sudden need to go before getting to the bathroom.   Going too often when your bladder isn't actually full can disrupt the body's automatic signals to store and hold urine longer, which will increase urgency/frequency.  In this case, the bladder "is running the show" and strategies can be learned to retrain this pattern.   One should be able to control the first urge to urinate, at around 150mL.  The bladder can hold up to a "grande latte," or 400mL. To help you gain control, practice the Urge Drill below when urgency strikes.  This drill will help retrain your bladder signals and allow you to store and hold urine longer.  The overall goal is to stretch out your time between voids to reach a more manageable voiding schedule.    Practice your "quick flicks" often throughout the day (each waking hour) even when you don't need feel the urge to go.  This will help strengthen your pelvic floor muscles, making them more effective in controlling leakage.  Urge Drill  When you feel an urge to go, follow these steps to regain control: Stop what you are doing and be still Take one deep  breath, directing your air into your abdomen Think an affirming thought, such as "I've got this." Do 5 quick flicks of your pelvic floor Walk with control to the bathroom to void, or delay voiding   

## 2022-07-26 NOTE — Therapy (Signed)
OUTPATIENT PHYSICAL THERAPY FEMALE PELVIC EVALUATION   Patient Name: Joyce Stewart MRN: [id number] DOB:Apr 01, 1991, 32 y.o., female Today's Date: 07/26/2022  END OF SESSION:  PT End of Session - 07/26/22 0856     Visit Number 1    Date for PT Re-Evaluation 10/25/22    Authorization Type [employer]/[place] perferred    PT Start Time 0855   pt arrival time   PT Stop Time 0930    PT Time Calculation (min) 35 min    Activity Tolerance Patient tolerated treatment well    Behavior During Therapy [facility] for tasks assessed/performed             Past Medical History:  Diagnosis Date   Chronic constipation 10/18/2017   Tendonitis of finger 05/15/2019   L thumb   Vaginal Pap smear, abnormal    No past surgical history on file. Patient Active Problem List   Diagnosis Date Noted   Indication for care or intervention in labor or delivery 05/11/2022   GBS (group B Streptococcus carrier), +RV culture, currently pregnant 04/24/2022   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 12/26/2021   PCP: Randa Ngo, CNM   REFERRING PROVIDER: Levie Heritage, DO   REFERRING DIAG: N39.3 (ICD-10-CM) - Stress incontinence of urine   THERAPY DIAG:  Muscle weakness (generalized)   Unspecified lack of coordination   Abnormal posture   Rationale for Evaluation and Treatment Rehabilitation   ONSET DATE: 05/11/22   SUBJECTIVE:                                                                                                                                                                                            SUBJECTIVE STATEMENT: After delivery had some urinary incontinence, mild constipation, and pain with sex.      PAIN:  Are you having pain? Yes - with sex 4/10     PRECAUTIONS: no    WEIGHT BEARING RESTRICTIONS No   FALLS:  Has patient fallen in last 6 months? No   LIVING ENVIRONMENT: Lives with: lives with their family Lives in: House/apartment     OCCUPATION: Runner, broadcasting/film/video -  kindergarten     PLOF: Independent   PATIENT GOALS to have less leakage and less pain   PERTINENT HISTORY:  2 previous miscarriages, vaginal delivery feb  Sexual abuse: No   BOWEL MOVEMENT Pain with bowel movement: No Type of bowel movement:Type (Bristol Stool Scale) 2-4, Frequency daily to every other day -to every 3 days, and Strain sometimes Fully empty rectum: No Leakage: No Pads: No Fiber supplement: No   URINATION Pain with urination: No Fully empty bladder:  yes  Stream: Strong Urgency: yes  Frequency: not quicker than every 2 hours Leakage:  some leakage with holding it too long - 10-15 mins after urge starts then leaks a bit getting to bathroom Pads: No   INTERCOURSE Pain with intercourse:  painful with penetration, during, and a little after - thinks is dryness Ability to have vaginal penetration:  Yes:  but painful Climax: not painful Marinoff Scale: 0/3   PREGNANCY Vaginal deliveries 1 - did have epidural  Tearing Bialteral Labial  C-section deliveries 0 Currently pregnant no  Has had 2 miscarriages PROLAPSE None       OBJECTIVE:    DIAGNOSTIC FINDINGS:      COGNITION:            Overall cognitive status: Within functional limits for tasks assessed                          SENSATION:            Light touch: Appears intact            Proprioception: Appears intact   MUSCLE LENGTH: Bil hamstrings and adductors limited by 25%                POSTURE: forward head, increased kyphosis, anterior pelvic tilt     LUMBARAROM/PROM   A/PROM A/PROM  eval  Flexion Decreased by 25%  Extension WFL  Right lateral flexion WFL  Left lateral flexion Decreased by 25%  Right rotation Decreased by 25%  Left rotation WFL   (Blank rows = not tested)   LOWER EXTREMITY ROM:   WFL   LOWER EXTREMITY MMT:   Bil hips grossly 4/5; knees and ankles 5/5  PALPATION:   General  no TTP or fascial restrictions noted                  External Perineal Exam mild  dryness noted                             Internal Pelvic Floor TTP at Lt superficial and deep pelvic floor muscle layers and mildly in posterior direction at introitus    Patient confirms identification and approves PT to assess internal pelvic floor and treatment YES   PELVIC MMT:   MMT eval  Vaginal  3/5, 7s, 6 reps  Internal Anal Sphincter    External Anal Sphincter    Puborectalis    Diastasis Recti    (Blank rows = not tested)         TONE: WFL   PROLAPSE: Not seen in hooklying    TODAY'S TREATMENT  07/26/2022 - Examination completed, findings reviewed, pt educated on POC, HEP, and education on feminine moisturizers and lubricants and urge drill. Pt motivated to participate in PT and agreeable to attempt recommendations.       PATIENT EDUCATION:  Education details: [id number] Person educated: Patient Education method: Programmer, multimedia, Demonstration, Tactile cues, Verbal cues, and Handouts Education comprehension: verbalized understanding and returned demonstration     HOME EXERCISE PROGRAM: [id number]  ASSESSMENT:  CLINICAL IMPRESSION: Patient is a 32 y.o. female  who was seen today for physical therapy evaluation and treatment for urinary leakage with urge/holding it too long, painful intercourse, and mild constipation in postpartum. Pt had first child in 05/2022 vaginally with bil labial tearing and since has had this symptoms. Pt reports she is now using lubricant and this  helps pain with penetration a bit but still remains. Pt found to have decreased flexibility in spine and bil hips, decreased core and hip strength. Pt had mild TTP over pubic symphysis. Patient consented to internal pelvic floor assessment vaginally this date and found to have decreased strength, endurance, and coordination. Patient benefited from verbal cues for improved technique with pelvic floor contractions with breathing mechanics. Pt had TTP at lt side of pelvic floor with decreased mobility noted here  as well. No TTP at rt side and slight tightness noted but minimal and no pain. Pt did have dryness at external pelvic floor and reported feeling dry internally as well. Pt given handouts for feminine moisturizers, lubricants and educated on urge drill with updates to HEP given as well. Pt tolerated well and motivated to participate and improve with PT. Pt would benefit from additional PT to further address deficits.    OBJECTIVE IMPAIRMENTS: decreased coordination, decreased endurance, decreased strength, impaired flexibility, improper body mechanics, postural dysfunction, and pain.   ACTIVITY LIMITATIONS: continence  PARTICIPATION LIMITATIONS: interpersonal relationship, community activity, and occupation  PERSONAL FACTORS: 1 comorbidity: recent postpartum vaginal birth  are also affecting patient's functional outcome.   REHAB POTENTIAL: Good  CLINICAL DECISION MAKING: Stable/uncomplicated  EVALUATION COMPLEXITY: Low   GOALS: Goals reviewed with patient? Yes  SHORT TERM GOALS: Target date: 08/23/22  Pt to be I with advanced HEP. Baseline: Goal status: INITIAL  2.  Pt will have 25% less urgency due to bladder retraining and strengthening  Baseline:  Goal status: INITIAL  3.  Pt to be I with voiding and breathing mechanics to improve frequency of bowel movement to at least every other day to return to pt's normal.  Baseline:  Goal status: INITIAL   LONG TERM GOALS: Target date: 10/25/22  Pt to be I with advanced HEP.  Baseline:  Goal status: INITIAL  2.  Pt will have 50% less urgency due to bladder retraining and strengthening  Baseline:  Goal status: INITIAL  3.  Pt to demonstrate at least 4/5 pelvic floor strength and ability to hold at least 9s for improved pelvic stability and decreased strain at pelvic floor/ decrease leakage.  Baseline:  Goal status: INITIAL  4.  Pt to demonstrate improved coordination of pelvic floor and breathing mechanics with 20# squat with  appropriate synergistic patterns to decrease pain and leakage at least 75% of the time.    Baseline:  Goal status: INITIAL  5.  Pt to report no more than 1 instance of urinary leakage in one month for improved QOL and ability to hold urine to make to bathroom at work.  Baseline:  Goal status: INITIAL  6.  Pt will report no more than 1/10  pain during vaginal penetration and intercourse due to improvements in posture, strength, and muscle length  Baseline:  Goal status: INITIAL  PLAN:  PT FREQUENCY: every other week  PT DURATION:  6 sessions  PLANNED INTERVENTIONS: Therapeutic exercises, Therapeutic activity, Neuromuscular re-education, Balance training, Patient/Family education, Self Care, Dry Needling, Spinal mobilization, Cryotherapy, Moist heat, scar mobilization, Taping, Biofeedback, and Manual therapy  PLAN FOR NEXT SESSION: internal manual if needed and pt consents, core and hip strengthening, coordination of pelvic floor and breathing with strengthening exercises, erogenics for child care giving.   Otelia Sergeant, PT, DPT 07/25/2408:02 AM

## 2022-07-27 DIAGNOSIS — F4320 Adjustment disorder, unspecified: Secondary | ICD-10-CM | POA: Diagnosis not present

## 2022-08-07 ENCOUNTER — Ambulatory Visit: Payer: No Typology Code available for payment source | Attending: Family Medicine | Admitting: Physical Therapy

## 2022-08-07 DIAGNOSIS — M6281 Muscle weakness (generalized): Secondary | ICD-10-CM

## 2022-08-07 DIAGNOSIS — R279 Unspecified lack of coordination: Secondary | ICD-10-CM

## 2022-08-07 DIAGNOSIS — R293 Abnormal posture: Secondary | ICD-10-CM | POA: Diagnosis not present

## 2022-08-07 NOTE — Therapy (Signed)
OUTPATIENT PHYSICAL THERAPY FEMALE PELVIC EVALUATION   Patient Name: Joyce Stewart MRN: [id number] DOB:29-Nov-1990, 32 y.o., female Today's Date: 08/07/2022  END OF SESSION:  PT End of Session - 08/07/22 1532     Visit Number 2    Date for PT Re-Evaluation 10/25/22    Authorization Type [employer]/[place] perferred    PT Start Time 1532    PT Stop Time 1612    PT Time Calculation (min) 40 min    Activity Tolerance Patient tolerated treatment well    Behavior During Therapy [facility] for tasks assessed/performed             Past Medical History:  Diagnosis Date   Chronic constipation 10/18/2017   Tendonitis of finger 05/15/2019   L thumb   Vaginal Pap smear, abnormal    No past surgical history on file. Patient Active Problem List   Diagnosis Date Noted   Indication for care or intervention in labor or delivery 05/11/2022   GBS (group B Streptococcus carrier), +RV culture, currently pregnant 04/24/2022   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 12/26/2021   PCP: Randa Ngo, CNM   REFERRING PROVIDER: Levie Heritage, DO   REFERRING DIAG: N39.3 (ICD-10-CM) - Stress incontinence of urine   THERAPY DIAG:  Muscle weakness (generalized)   Unspecified lack of coordination   Abnormal posture   Rationale for Evaluation and Treatment Rehabilitation   ONSET DATE: 05/11/22   SUBJECTIVE:                                                                                                                                                                                            SUBJECTIVE STATEMENT: Pt reports no leakage in the last week and hasn't had sex since last visit so unsure if that has improved. Unable to do urge drill as she isn't able to relax after initial contraction however doesn't leak and is able to urinate when in bathroom.    PAIN:  Are you having pain? Yes - with sex 4/10     PRECAUTIONS: no    WEIGHT BEARING RESTRICTIONS No   FALLS:  Has patient fallen  in last 6 months? No   LIVING ENVIRONMENT: Lives with: lives with their family Lives in: House/apartment     OCCUPATION: Runner, broadcasting/film/video - kindergarten     PLOF: Independent   PATIENT GOALS to have less leakage and less pain   PERTINENT HISTORY:  2 previous miscarriages, vaginal delivery feb  Sexual abuse: No   BOWEL MOVEMENT Pain with bowel movement: No Type of bowel movement:Type (Bristol Stool Scale) 2-4, Frequency daily to every other day -  to every 3 days, and Strain sometimes Fully empty rectum: No Leakage: No Pads: No Fiber supplement: No   URINATION Pain with urination: No Fully empty bladder: yes  Stream: Strong Urgency: yes  Frequency: not quicker than every 2 hours Leakage:  some leakage with holding it too long - 10-15 mins after urge starts then leaks a bit getting to bathroom Pads: No   INTERCOURSE Pain with intercourse:  painful with penetration, during, and a little after - thinks is dryness Ability to have vaginal penetration:  Yes:  but painful Climax: not painful Marinoff Scale: 0/3   PREGNANCY Vaginal deliveries 1 - did have epidural  Tearing Bialteral Labial  C-section deliveries 0 Currently pregnant no  Has had 2 miscarriages PROLAPSE None       OBJECTIVE:    DIAGNOSTIC FINDINGS:      COGNITION:            Overall cognitive status: Within functional limits for tasks assessed                          SENSATION:            Light touch: Appears intact            Proprioception: Appears intact   MUSCLE LENGTH: Bil hamstrings and adductors limited by 25%                POSTURE: forward head, increased kyphosis, anterior pelvic tilt     LUMBARAROM/PROM   A/PROM A/PROM  eval  Flexion Decreased by 25%  Extension WFL  Right lateral flexion WFL  Left lateral flexion Decreased by 25%  Right rotation Decreased by 25%  Left rotation WFL   (Blank rows = not tested)   LOWER EXTREMITY ROM:   WFL   LOWER EXTREMITY MMT:   Bil hips  grossly 4/5; knees and ankles 5/5  PALPATION:   General  no TTP or fascial restrictions noted   - tight lumbar and thoracic spine               External Perineal Exam mild dryness noted                             Internal Pelvic Floor TTP at Lt superficial and deep pelvic floor muscle layers and mildly in posterior direction at introitus    Patient confirms identification and approves PT to assess internal pelvic floor and treatment YES   PELVIC MMT:   MMT eval  Vaginal  3/5, 7s, 6 reps  Internal Anal Sphincter    External Anal Sphincter    Puborectalis    Diastasis Recti    (Blank rows = not tested)         TONE: WFL   PROLAPSE: Not seen in hooklying    TODAY'S TREATMENT  07/26/2022 - Examination completed, findings reviewed, pt educated on POC, HEP, and education on feminine moisturizers and lubricants and urge drill. Pt motivated to participate in PT and agreeable to attempt recommendations.    08/07/22 Opp hand/knee ball press 2x10 Dead bugs 2x10 Blue band shoulder horizontal abduction 2x10 Blue band shoulder diagonals 2x10  Blue band rows 2x10 Thoracic openers 2x30s each Foam roller 2x30s vertical at spine and thoracic horizontal     PATIENT EDUCATION:  Education details: [id number] Person educated: Patient Education method: Explanation, Demonstration, Tactile cues, Verbal cues, and Handouts Education comprehension: verbalized understanding and returned  demonstration     HOME EXERCISE PROGRAM: [id number]  ASSESSMENT:  CLINICAL IMPRESSION: Patient session focused on stretching thoracic spine as pt is very tight here and strengthening core/back/hips for improved pelvic support and strength.  Pt tolerated well and denied any symptoms. HEP updated and reviewed with pt.  Pt would benefit from additional PT to further address deficits.    OBJECTIVE IMPAIRMENTS: decreased coordination, decreased endurance, decreased strength, impaired flexibility, improper body  mechanics, postural dysfunction, and pain.   ACTIVITY LIMITATIONS: continence  PARTICIPATION LIMITATIONS: interpersonal relationship, community activity, and occupation  PERSONAL FACTORS: 1 comorbidity: recent postpartum vaginal birth  are also affecting patient's functional outcome.   REHAB POTENTIAL: Good  CLINICAL DECISION MAKING: Stable/uncomplicated  EVALUATION COMPLEXITY: Low   GOALS: Goals reviewed with patient? Yes  SHORT TERM GOALS: Target date: 08/23/22  Pt to be I with advanced HEP. Baseline: Goal status: INITIAL  2.  Pt will have 25% less urgency due to bladder retraining and strengthening  Baseline:  Goal status: INITIAL  3.  Pt to be I with voiding and breathing mechanics to improve frequency of bowel movement to at least every other day to return to pt's normal.  Baseline:  Goal status: INITIAL   LONG TERM GOALS: Target date: 10/25/22  Pt to be I with advanced HEP.  Baseline:  Goal status: INITIAL  2.  Pt will have 50% less urgency due to bladder retraining and strengthening  Baseline:  Goal status: INITIAL  3.  Pt to demonstrate at least 4/5 pelvic floor strength and ability to hold at least 9s for improved pelvic stability and decreased strain at pelvic floor/ decrease leakage.  Baseline:  Goal status: INITIAL  4.  Pt to demonstrate improved coordination of pelvic floor and breathing mechanics with 20# squat with appropriate synergistic patterns to decrease pain and leakage at least 75% of the time.    Baseline:  Goal status: INITIAL  5.  Pt to report no more than 1 instance of urinary leakage in one month for improved QOL and ability to hold urine to make to bathroom at work.  Baseline:  Goal status: INITIAL  6.  Pt will report no more than 1/10  pain during vaginal penetration and intercourse due to improvements in posture, strength, and muscle length  Baseline:  Goal status: INITIAL  PLAN:  PT FREQUENCY: every other week  PT DURATION:   6 sessions  PLANNED INTERVENTIONS: Therapeutic exercises, Therapeutic activity, Neuromuscular re-education, Balance training, Patient/Family education, Self Care, Dry Needling, Spinal mobilization, Cryotherapy, Moist heat, scar mobilization, Taping, Biofeedback, and Manual therapy  PLAN FOR NEXT SESSION: internal manual if needed and pt consents, core and hip strengthening, coordination of pelvic floor and breathing with strengthening exercises, erogenics for child care giving.   Otelia Sergeant, PT, DPT 05/06/245:06 PM

## 2022-08-22 ENCOUNTER — Ambulatory Visit: Payer: No Typology Code available for payment source | Admitting: Physical Therapy

## 2022-08-31 DIAGNOSIS — F411 Generalized anxiety disorder: Secondary | ICD-10-CM | POA: Diagnosis not present

## 2022-09-06 ENCOUNTER — Ambulatory Visit: Payer: No Typology Code available for payment source | Attending: Family Medicine | Admitting: Physical Therapy

## 2022-09-06 DIAGNOSIS — R279 Unspecified lack of coordination: Secondary | ICD-10-CM | POA: Diagnosis not present

## 2022-09-06 DIAGNOSIS — M6281 Muscle weakness (generalized): Secondary | ICD-10-CM

## 2022-09-06 DIAGNOSIS — R293 Abnormal posture: Secondary | ICD-10-CM | POA: Diagnosis not present

## 2022-09-06 NOTE — Therapy (Signed)
OUTPATIENT PHYSICAL THERAPY FEMALE PELVIC EVALUATION   Patient Name: Joyce Stewart MRN: [id number] DOB:1990/11/19, 32 y.o., female Today's Date: 09/06/2022  END OF SESSION:  PT End of Session - 09/06/22 1359     Visit Number 3    Date for PT Re-Evaluation 10/25/22    Authorization Type [employer]/[place] perferred    PT Start Time 1400    PT Stop Time 1440    PT Time Calculation (min) 40 min    Activity Tolerance Patient tolerated treatment well    Behavior During Therapy WFL for tasks assessed/performed             Past Medical History:  Diagnosis Date   Chronic constipation 10/18/2017   Tendonitis of finger 05/15/2019   L thumb   Vaginal Pap smear, abnormal    No past surgical history on file. Patient Active Problem List   Diagnosis Date Noted   Indication for care or intervention in labor or delivery 05/11/2022   GBS (group B Streptococcus carrier), +RV culture, currently pregnant 04/24/2022   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 12/26/2021   PCP: Randa Ngo, CNM   REFERRING PROVIDER: Levie Heritage, DO   REFERRING DIAG: N39.3 (ICD-10-CM) - Stress incontinence of urine   THERAPY DIAG:  Muscle weakness (generalized)   Unspecified lack of coordination   Abnormal posture   Rationale for Evaluation and Treatment Rehabilitation   ONSET DATE: 05/11/22   SUBJECTIVE:                                                                                                                                                                                            SUBJECTIVE STATEMENT: No leakage still, core and posture exercises have been going well, has been working out more now. Still a little pain with sex but improving.    PAIN:  Are you having pain? Yes - with sex 4/10     PRECAUTIONS: no    WEIGHT BEARING RESTRICTIONS No   FALLS:  Has patient fallen in last 6 months? No   LIVING ENVIRONMENT: Lives with: lives with their family Lives in:  House/apartment     OCCUPATION: Runner, broadcasting/film/video - kindergarten     PLOF: Independent   PATIENT GOALS to have less leakage and less pain   PERTINENT HISTORY:  2 previous miscarriages, vaginal delivery feb  Sexual abuse: No   BOWEL MOVEMENT Pain with bowel movement: No Type of bowel movement:Type (Bristol Stool Scale) 2-4, Frequency daily to every other day -to every 3 days, and Strain sometimes Fully empty rectum: No Leakage: No Pads: No Fiber supplement: No   URINATION  Pain with urination: No Fully empty bladder: yes  Stream: Strong Urgency: yes  Frequency: not quicker than every 2 hours Leakage:  some leakage with holding it too long - 10-15 mins after urge starts then leaks a bit getting to bathroom Pads: No   INTERCOURSE Pain with intercourse:  painful with penetration, during, and a little after - thinks is dryness Ability to have vaginal penetration:  Yes:  but painful Climax: not painful Marinoff Scale: 0/3   PREGNANCY Vaginal deliveries 1 - did have epidural  Tearing Bialteral Labial  C-section deliveries 0 Currently pregnant no  Has had 2 miscarriages PROLAPSE None       OBJECTIVE:    DIAGNOSTIC FINDINGS:      COGNITION:            Overall cognitive status: Within functional limits for tasks assessed                          SENSATION:            Light touch: Appears intact            Proprioception: Appears intact   MUSCLE LENGTH: Bil hamstrings and adductors limited by 25%                POSTURE: forward head, increased kyphosis, anterior pelvic tilt     LUMBARAROM/PROM   A/PROM A/PROM  eval  Flexion Decreased by 25%  Extension WFL  Right lateral flexion WFL  Left lateral flexion Decreased by 25%  Right rotation Decreased by 25%  Left rotation WFL   (Blank rows = not tested)   LOWER EXTREMITY ROM:   WFL   LOWER EXTREMITY MMT:   Bil hips grossly 4/5; knees and ankles 5/5  PALPATION:   General  no TTP or fascial restrictions noted    - tight lumbar and thoracic spine               External Perineal Exam mild dryness noted                             Internal Pelvic Floor TTP at Lt superficial and deep pelvic floor muscle layers and mildly in posterior direction at introitus    Patient confirms identification and approves PT to assess internal pelvic floor and treatment YES   PELVIC MMT:   MMT eval  Vaginal  3/5, 7s, 6 reps  Internal Anal Sphincter    External Anal Sphincter    Puborectalis    Diastasis Recti    (Blank rows = not tested)         TONE: WFL   PROLAPSE: Not seen in hooklying    TODAY'S TREATMENT   08/07/22 Opp hand/knee ball press 2x10 Dead bugs 2x10 Blue band shoulder horizontal abduction 2x10 Blue band shoulder diagonals 2x10  Blue band rows 2x10 Thoracic openers 2x30s each Foam roller 2x30s vertical at spine and thoracic horizontal  09/06/22: Mid thoracic stretching on foam roller 2x1 min X10 rib mobilization with diaphragmatic breathing in hooklying  X10 squats 10# Cable rows 10# x10 each Cable shoulder retraction 10# x10  Lateral lunge x10 each at 75% range due to pain at pubic symphysis with 100% range Step ups on mat tables 5# x10 each leg leading Tennis ball at mid thoracic spine in hooklying for improved muscle mobility and decreased tension 2 mins Educated on dry needling for possible next appt  plan      PATIENT EDUCATION:  Education details: [id number] Person educated: Patient Education method: Explanation, Demonstration, Tactile cues, Verbal cues, and Handouts Education comprehension: verbalized understanding and returned demonstration     HOME EXERCISE PROGRAM: [id number]  ASSESSMENT:  CLINICAL IMPRESSION: Patient session focused on stretching thoracic spine as pt is very tight here and strengthening core/back/hips for improved pelvic support and strength.  Pt tolerated well and denied any symptoms. HEP updated and reviewed with pt.  Pt would benefit from  additional PT to further address deficits.    OBJECTIVE IMPAIRMENTS: decreased coordination, decreased endurance, decreased strength, impaired flexibility, improper body mechanics, postural dysfunction, and pain.   ACTIVITY LIMITATIONS: continence  PARTICIPATION LIMITATIONS: interpersonal relationship, community activity, and occupation  PERSONAL FACTORS: 1 comorbidity: recent postpartum vaginal birth  are also affecting patient's functional outcome.   REHAB POTENTIAL: Good  CLINICAL DECISION MAKING: Stable/uncomplicated  EVALUATION COMPLEXITY: Low   GOALS: Goals reviewed with patient? Yes  SHORT TERM GOALS: Target date: 08/23/22  Pt to be I with advanced HEP. Baseline: Goal status: INITIAL  2.  Pt will have 25% less urgency due to bladder retraining and strengthening  Baseline:  Goal status: INITIAL  3.  Pt to be I with voiding and breathing mechanics to improve frequency of bowel movement to at least every other day to return to pt's normal.  Baseline:  Goal status: INITIAL   LONG TERM GOALS: Target date: 10/25/22  Pt to be I with advanced HEP.  Baseline:  Goal status: INITIAL  2.  Pt will have 50% less urgency due to bladder retraining and strengthening  Baseline:  Goal status: INITIAL  3.  Pt to demonstrate at least 4/5 pelvic floor strength and ability to hold at least 9s for improved pelvic stability and decreased strain at pelvic floor/ decrease leakage.  Baseline:  Goal status: INITIAL  4.  Pt to demonstrate improved coordination of pelvic floor and breathing mechanics with 20# squat with appropriate synergistic patterns to decrease pain and leakage at least 75% of the time.    Baseline:  Goal status: INITIAL  5.  Pt to report no more than 1 instance of urinary leakage in one month for improved QOL and ability to hold urine to make to bathroom at work.  Baseline:  Goal status: INITIAL  6.  Pt will report no more than 1/10  pain during vaginal penetration  and intercourse due to improvements in posture, strength, and muscle length  Baseline:  Goal status: INITIAL  PLAN:  PT FREQUENCY: every other week  PT DURATION:  6 sessions  PLANNED INTERVENTIONS: Therapeutic exercises, Therapeutic activity, Neuromuscular re-education, Balance training, Patient/Family education, Self Care, Dry Needling, Spinal mobilization, Cryotherapy, Moist heat, scar mobilization, Taping, Biofeedback, and Manual therapy  PLAN FOR NEXT SESSION: dry needling at mid thoracic spine for improved rib mobility and decreased pain, posture and core and glute strengthening  Otelia Sergeant, PT, DPT 06/05/242:53 PM

## 2022-09-19 ENCOUNTER — Encounter: Payer: No Typology Code available for payment source | Admitting: Physical Therapy

## 2022-09-21 ENCOUNTER — Telehealth: Payer: Self-pay

## 2022-09-21 NOTE — Telephone Encounter (Signed)
Left voicemail for patient to call back and schedule her annual exam with Dr. Adrian Blackwater.

## 2022-09-27 ENCOUNTER — Ambulatory Visit: Payer: No Typology Code available for payment source | Admitting: Physical Therapy

## 2022-09-27 DIAGNOSIS — M6281 Muscle weakness (generalized): Secondary | ICD-10-CM | POA: Diagnosis not present

## 2022-09-27 DIAGNOSIS — R279 Unspecified lack of coordination: Secondary | ICD-10-CM | POA: Diagnosis not present

## 2022-09-27 DIAGNOSIS — R293 Abnormal posture: Secondary | ICD-10-CM

## 2022-09-27 NOTE — Therapy (Signed)
OUTPATIENT PHYSICAL THERAPY FEMALE PELVIC PROGRESS NOTE   Patient Name: Joyce Stewart MRN: [id number] DOB:04-29-1990, 32 y.o., female Today's Date: 09/27/2022  END OF SESSION:  PT End of Session - 09/27/22 1238     Visit Number 4    Date for PT Re-Evaluation 10/25/22    Authorization Type [employer]/[place] perferred    PT Start Time 1238    PT Stop Time 1316    PT Time Calculation (min) 38 min    Activity Tolerance Patient tolerated treatment well             Past Medical History:  Diagnosis Date   Chronic constipation 10/18/2017   Tendonitis of finger 05/15/2019   L thumb   Vaginal Pap smear, abnormal    No past surgical history on file. Patient Active Problem List   Diagnosis Date Noted   Indication for care or intervention in labor or delivery 05/11/2022   GBS (group B Streptococcus carrier), +RV culture, currently pregnant 04/24/2022   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 12/26/2021   PCP: Randa Ngo, CNM   REFERRING PROVIDER: Levie Heritage, DO   REFERRING DIAG: N39.3 (ICD-10-CM) - Stress incontinence of urine   THERAPY DIAG:  Muscle weakness (generalized)   Unspecified lack of coordination   Abnormal posture   Rationale for Evaluation and Treatment Rehabilitation   ONSET DATE: 05/11/22   SUBJECTIVE:                                                                                                                                                                                            SUBJECTIVE STATEMENT: Tightness in mid thoracic with holding the baby;  been trying tall planks but that bothers my mid back;  has a foam roll at home   PAIN:  Are you having pain? Yes - with sex 4/10     PRECAUTIONS: no    WEIGHT BEARING RESTRICTIONS No   FALLS:  Has patient fallen in last 6 months? No   LIVING ENVIRONMENT: Lives with: lives with their family Lives in: House/apartment     OCCUPATION: Runner, broadcasting/film/video - kindergarten     PLOF: Independent    PATIENT GOALS to have less leakage and less pain   PERTINENT HISTORY:  2 previous miscarriages, vaginal delivery feb  Sexual abuse: No   BOWEL MOVEMENT Pain with bowel movement: No Type of bowel movement:Type (Bristol Stool Scale) 2-4, Frequency daily to every other day -to every 3 days, and Strain sometimes Fully empty rectum: No Leakage: No Pads: No Fiber supplement: No   URINATION Pain with urination: No Fully empty bladder: yes  Stream: Strong Urgency: yes  Frequency: not quicker than every 2 hours Leakage:  some leakage with holding it too long - 10-15 mins after urge starts then leaks a bit getting to bathroom Pads: No   INTERCOURSE Pain with intercourse:  painful with penetration, during, and a little after - thinks is dryness Ability to have vaginal penetration:  Yes:  but painful Climax: not painful Marinoff Scale: 0/3   PREGNANCY Vaginal deliveries 1 - did have epidural  Tearing Bialteral Labial  C-section deliveries 0 Currently pregnant no  Has had 2 miscarriages PROLAPSE None       OBJECTIVE:    DIAGNOSTIC FINDINGS:      COGNITION:            Overall cognitive status: Within functional limits for tasks assessed                          SENSATION:            Light touch: Appears intact            Proprioception: Appears intact   MUSCLE LENGTH: Bil hamstrings and adductors limited by 25%                POSTURE: forward head, increased kyphosis, anterior pelvic tilt     LUMBARAROM/PROM   A/PROM A/PROM  eval  Flexion Decreased by 25%  Extension WFL  Right lateral flexion WFL  Left lateral flexion Decreased by 25%  Right rotation Decreased by 25%  Left rotation WFL   (Blank rows = not tested)   LOWER EXTREMITY ROM:   WFL   LOWER EXTREMITY MMT:   Bil hips grossly 4/5; knees and ankles 5/5  PALPATION:   General  no TTP or fascial restrictions noted   - tight lumbar and thoracic spine               External Perineal Exam mild  dryness noted                             Internal Pelvic Floor TTP at Lt superficial and deep pelvic floor muscle layers and mildly in posterior direction at introitus    Patient confirms identification and approves PT to assess internal pelvic floor and treatment YES   PELVIC MMT:   MMT eval  Vaginal  3/5, 7s, 6 reps  Internal Anal Sphincter    External Anal Sphincter    Puborectalis    Diastasis Recti    (Blank rows = not tested)         TONE: WFL   PROLAPSE: Not seen in hooklying    TODAY'S TREATMENT  6/26: Modified planks:  elbows and knees added to HEP Education on sitting with support (lumbar roll and support under arms) when holding infant Manual therapy: soft tissue mobilization to mid thoracic paraspinals bil Trigger Point Dry-Needling  Treatment instructions: Expect mild to moderate muscle soreness. S/S of pneumothorax if dry needled over a lung field, and to seek immediate medical attention should they occur. Patient verbalized understanding of these instructions and education.  Patient Consent Given: Yes Education handout provided: Previously provided had at [facility] Muscles treated: left > right paraspinals mid thoracic and rhomboids Electrical stimulation performed: No Parameters: N/A Treatment response/outcome: improved soft tissue mobility and decreased taut bands Open books with foam roll 10x Quadruped thread the needle with foam roll 8x Quadruped open books with bent elbow  to thread the needle 8x Patient states she has mild right eye blurriness and asks if this is a response to DN (not consistent with treatment to thoracic region and thought to be related to lying prone with face in treatment table opening) BP taken: 109/64 Called patient at 5:15 pm to follow up regarding and residual symptoms, left message on voicemail    08/07/22 Opp hand/knee ball press 2x10 Dead bugs 2x10 Blue band shoulder horizontal abduction 2x10 Blue band shoulder diagonals 2x10   Blue band rows 2x10 Thoracic openers 2x30s each Foam roller 2x30s vertical at spine and thoracic horizontal  09/06/22: Mid thoracic stretching on foam roller 2x1 min X10 rib mobilization with diaphragmatic breathing in hooklying  X10 squats 10# Cable rows 10# x10 each Cable shoulder retraction 10# x10  Lateral lunge x10 each at 75% range due to pain at pubic symphysis with 100% range Step ups on mat tables 5# x10 each leg leading Tennis ball at mid thoracic spine in hooklying for improved muscle mobility and decreased tension 2 mins Educated on dry needling for possible next appt plan      PATIENT EDUCATION:  Education details: [id number] Person educated: Patient Education method: Programmer, multimedia, Facilities manager, Actor cues, Verbal cues, and Handouts Education comprehension: verbalized understanding and returned demonstration     HOME EXERCISE PROGRAM: [id number]  ASSESSMENT:  CLINICAL IMPRESSION: Good initial response to DN with twitch responses produced (a good prognostic indicator).  She also responds well to exercise focusing on thoracic mobility.  She does report a little right eye blurriness at the end of session which is not consistent with area treated.  BP taken and within normal range.  This unexpected symptom is thought to be positioning on the table in prone with face in treatment table opening.  Attempted to call at the end of the day for follow up on this but unable to reach.    OBJECTIVE IMPAIRMENTS: decreased coordination, decreased endurance, decreased strength, impaired flexibility, improper body mechanics, postural dysfunction, and pain.   ACTIVITY LIMITATIONS: continence  PARTICIPATION LIMITATIONS: interpersonal relationship, community activity, and occupation  PERSONAL FACTORS: 1 comorbidity: recent postpartum vaginal birth  are also affecting patient's functional outcome.   REHAB POTENTIAL: Good  CLINICAL DECISION MAKING: Stable/uncomplicated  EVALUATION  COMPLEXITY: Low   GOALS: Goals reviewed with patient? Yes  SHORT TERM GOALS: Target date: 08/23/22  Pt to be I with advanced HEP. Baseline: Goal status: INITIAL  2.  Pt will have 25% less urgency due to bladder retraining and strengthening  Baseline:  Goal status: INITIAL  3.  Pt to be I with voiding and breathing mechanics to improve frequency of bowel movement to at least every other day to return to pt's normal.  Baseline:  Goal status: INITIAL   LONG TERM GOALS: Target date: 10/25/22  Pt to be I with advanced HEP.  Baseline:  Goal status: INITIAL  2.  Pt will have 50% less urgency due to bladder retraining and strengthening  Baseline:  Goal status: INITIAL  3.  Pt to demonstrate at least 4/5 pelvic floor strength and ability to hold at least 9s for improved pelvic stability and decreased strain at pelvic floor/ decrease leakage.  Baseline:  Goal status: INITIAL  4.  Pt to demonstrate improved coordination of pelvic floor and breathing mechanics with 20# squat with appropriate synergistic patterns to decrease pain and leakage at least 75% of the time.    Baseline:  Goal status: INITIAL  5.  Pt to report no more  than 1 instance of urinary leakage in one month for improved QOL and ability to hold urine to make to bathroom at work.  Baseline:  Goal status: INITIAL  6.  Pt will report no more than 1/10  pain during vaginal penetration and intercourse due to improvements in posture, strength, and muscle length  Baseline:  Goal status: INITIAL  PLAN:  PT FREQUENCY: every other week  PT DURATION:  6 sessions  PLANNED INTERVENTIONS: Therapeutic exercises, Therapeutic activity, Neuromuscular re-education, Balance training, Patient/Family education, Self Care, Dry Needling, Spinal mobilization, Cryotherapy, Moist heat, scar mobilization, Taping, Biofeedback, and Manual therapy  PLAN FOR NEXT SESSION: assess response to dry needling (including eye blurriness) at mid  thoracic spine for improved rib mobility and decreased pain, posture and core and glute strengthening  Lavinia Sharps, PT 09/27/22 5:31 PM Phone: [phone] Fax: [phone]

## 2022-09-29 DIAGNOSIS — F331 Major depressive disorder, recurrent, moderate: Secondary | ICD-10-CM | POA: Diagnosis not present

## 2022-10-25 ENCOUNTER — Ambulatory Visit: Payer: No Typology Code available for payment source | Admitting: Physical Therapy

## 2022-10-26 DIAGNOSIS — F411 Generalized anxiety disorder: Secondary | ICD-10-CM | POA: Diagnosis not present

## 2022-11-06 ENCOUNTER — Ambulatory Visit: Payer: No Typology Code available for payment source | Attending: Family Medicine | Admitting: Physical Therapy

## 2022-11-06 DIAGNOSIS — R279 Unspecified lack of coordination: Secondary | ICD-10-CM | POA: Insufficient documentation

## 2022-11-06 DIAGNOSIS — R293 Abnormal posture: Secondary | ICD-10-CM | POA: Diagnosis not present

## 2022-11-06 DIAGNOSIS — M6281 Muscle weakness (generalized): Secondary | ICD-10-CM | POA: Insufficient documentation

## 2022-11-06 NOTE — Therapy (Unsigned)
OUTPATIENT PHYSICAL THERAPY FEMALE PELVIC TREATMENT   Patient Name: Joyce Stewart MRN: [id number] DOB:[birth year], 32 y.o., female Today's Date: 11/06/2022  END OF SESSION:  PT End of Session - 11/06/22 1624     Visit Number 5    Date for PT Re-Evaluation 10/25/22    Authorization Type [employer]/[place] perferred    PT Start Time 1620   arrival time   PT Stop Time 1658    PT Time Calculation (min) 38 min    Activity Tolerance Patient tolerated treatment well    Behavior During Therapy [facility] for tasks assessed/performed             Past Medical History:  Diagnosis Date   Chronic constipation 10/18/2017   Tendonitis of finger 05/15/2019   L thumb   Vaginal Pap smear, abnormal    No past surgical history on file. Patient Active Problem List   Diagnosis Date Noted   Indication for care or intervention in labor or delivery 05/11/2022   GBS (group B Streptococcus carrier), +RV culture, currently pregnant 04/24/2022   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 12/26/2021   PCP: Randa Ngo, CNM   REFERRING PROVIDER: Levie Heritage, DO   REFERRING DIAG: N39.3 (ICD-10-CM) - Stress incontinence of urine   THERAPY DIAG:  Muscle weakness (generalized)   Unspecified lack of coordination   Abnormal posture   Rationale for Evaluation and Treatment Rehabilitation   ONSET DATE: 05/11/22   SUBJECTIVE:                                                                                                                                                                                            SUBJECTIVE STATEMENT: Pt reports overall dry needling was really helpful last session but with traveling has had some return of back pain. Has had intermittent pelvic floor spasms but constant and mild.    PAIN:  Are you having pain? NO     PRECAUTIONS: no    WEIGHT BEARING RESTRICTIONS No   FALLS:  Has patient fallen in last 6 months? No   LIVING ENVIRONMENT: Lives with: lives with  their family Lives in: House/apartment     OCCUPATION: Runner, broadcasting/film/video - kindergarten     PLOF: Independent   PATIENT GOALS to have less leakage and less pain   PERTINENT HISTORY:  2 previous miscarriages, vaginal delivery feb  Sexual abuse: No   BOWEL MOVEMENT Pain with bowel movement: No Type of bowel movement:Type (Bristol Stool Scale) 2-4, Frequency daily to every other day -to every 3 days, and Strain sometimes Fully empty rectum: No Leakage: No Pads: No Fiber supplement:  No   URINATION Pain with urination: No Fully empty bladder: yes  Stream: Strong Urgency: yes  Frequency: not quicker than every 2 hours Leakage:  some leakage with holding it too long - 10-15 mins after urge starts then leaks a bit getting to bathroom Pads: No   INTERCOURSE Pain with intercourse:  painful with penetration, during, and a little after - thinks is dryness Ability to have vaginal penetration:  Yes:  but painful Climax: not painful Marinoff Scale: 0/3   PREGNANCY Vaginal deliveries 1 - did have epidural  Tearing Bialteral Labial  C-section deliveries 0 Currently pregnant no  Has had 2 miscarriages PROLAPSE None       OBJECTIVE:    DIAGNOSTIC FINDINGS:      COGNITION:            Overall cognitive status: Within functional limits for tasks assessed                          SENSATION:            Light touch: Appears intact            Proprioception: Appears intact   MUSCLE LENGTH: Bil hamstrings and adductors limited by 25%                POSTURE: forward head, increased kyphosis, anterior pelvic tilt     LUMBARAROM/PROM   A/PROM A/PROM  eval  Flexion Decreased by 25%  Extension WFL  Right lateral flexion WFL  Left lateral flexion Decreased by 25%  Right rotation Decreased by 25%  Left rotation WFL   (Blank rows = not tested)   LOWER EXTREMITY ROM:   WFL   LOWER EXTREMITY MMT:   Bil hips grossly 4/5; knees and ankles 5/5  PALPATION:   General  no TTP or  fascial restrictions noted   - tight lumbar and thoracic spine               External Perineal Exam mild dryness noted                             Internal Pelvic Floor TTP at Lt superficial and deep pelvic floor muscle layers and mildly in posterior direction at introitus    Patient confirms identification and approves PT to assess internal pelvic floor and treatment YES   PELVIC MMT:   MMT eval 11/06/22   Vaginal  3/5, 7s, 6 reps 5/5 6s, 7 reps (after this dropped to 4/5 but able to hold this for 10s)  Internal Anal Sphincter     External Anal Sphincter     Puborectalis     Diastasis Recti     (Blank rows = not tested)         TONE: WFL   PROLAPSE: Not seen in hooklying    TODAY'S TREATMENT  11/06/22: Patient consented to internal pelvic floor reassessment vaginally this date and found to have improved strength, endurance, and coordination. Patient able to demonstrate 5/5 with 7 reps however did drop to 4/5 at rep 8 and able to hold for 10s for additional reps. Pt tolerated well without pain, very minimal tension noted.  HEP reviewed with pt and she denied questions.   HOME EXERCISE PROGRAM: [id number]  ASSESSMENT:  CLINICAL IMPRESSION: Pt reports her symptoms have greatly improved since starting PT. Has been traveling a lot and reports she feels confident maintaining progress on  her own and no longer having any urinary leakage issues, no pain internally, back pain is better with stretching and more awareness of core activation. Pt states she has been working out more and now understands how to make sure core is activated and has noted decreased pain in back and pelvis. Pt consented to internal reassessment today and noticed improvement with internal strength/endurance/coordination.   OBJECTIVE IMPAIRMENTS: decreased coordination, decreased endurance, decreased strength, impaired flexibility, improper body mechanics, postural dysfunction, and pain.   ACTIVITY LIMITATIONS:  continence  PARTICIPATION LIMITATIONS: interpersonal relationship, community activity, and occupation  PERSONAL FACTORS: 1 comorbidity: recent postpartum vaginal birth  are also affecting patient's functional outcome.   REHAB POTENTIAL: Good  CLINICAL DECISION MAKING: Stable/uncomplicated  EVALUATION COMPLEXITY: Low   GOALS: Goals reviewed with patient? Yes  SHORT TERM GOALS: Target date: 08/23/22  Pt to be I with advanced HEP. Baseline: Goal status: MET  2.  Pt will have 25% less urgency due to bladder retraining and strengthening  Baseline:  Goal status: MET  3.  Pt to be I with voiding and breathing mechanics to improve frequency of bowel movement to at least every other day to return to pt's normal.  Baseline:  Goal status: MET   LONG TERM GOALS: Target date: 10/25/22  Pt to be I with advanced HEP.  Baseline:  Goal status: MET  2.  Pt will have 50% less urgency due to bladder retraining and strengthening  Baseline:  Goal status: MET  3.  Pt to demonstrate at least 4/5 pelvic floor strength and ability to hold at least 9s for improved pelvic stability and decreased strain at pelvic floor/ decrease leakage.  Baseline:  Goal status: MET  4.  Pt to demonstrate improved coordination of pelvic floor and breathing mechanics with 20# squat with appropriate synergistic patterns to decrease pain and leakage at least 75% of the time.    Baseline:  Goal status: MET  5.  Pt to report no more than 1 instance of urinary leakage in one month for improved QOL and ability to hold urine to make to bathroom at work.  Baseline:  Goal status: MET  6.  Pt will report no more than 1/10  pain during vaginal penetration and intercourse due to improvements in posture, strength, and muscle length  Baseline:  Goal status: MET  PLAN:  PT FREQUENCY: every other week  PT DURATION:  6 sessions  PLANNED INTERVENTIONS: Therapeutic exercises, Therapeutic activity, Neuromuscular  re-education, Balance training, Patient/Family education, Self Care, Dry Needling, Spinal mobilization, Cryotherapy, Moist heat, scar mobilization, Taping, Biofeedback, and Manual therapy  PLAN FOR NEXT SESSION: assess response to dry needling (including eye blurriness) at mid thoracic spine for improved rib mobility and decreased pain, posture and core and glute strengthening PHYSICAL THERAPY DISCHARGE SUMMARY  Visits from Start of Care: 5  Current functional level related to goals / functional outcomes: All goals met   Remaining deficits: All goals met   Education / Equipment: HEP   Patient agrees to discharge. Patient goals were met. Patient is being discharged due to meeting the stated rehab goals.   Otelia Sergeant, PT, DPT 08/06/248:45 AM

## 2022-11-30 DIAGNOSIS — F411 Generalized anxiety disorder: Secondary | ICD-10-CM | POA: Diagnosis not present

## 2022-12-11 ENCOUNTER — Ambulatory Visit (INDEPENDENT_AMBULATORY_CARE_PROVIDER_SITE_OTHER): Payer: No Typology Code available for payment source | Admitting: Family Medicine

## 2022-12-11 ENCOUNTER — Encounter: Payer: Self-pay | Admitting: Family Medicine

## 2022-12-11 VITALS — BP 100/62 | HR 70 | Ht 64.0 in | Wt 121.0 lb

## 2022-12-11 DIAGNOSIS — Z0000 Encounter for general adult medical examination without abnormal findings: Secondary | ICD-10-CM | POA: Diagnosis not present

## 2022-12-11 DIAGNOSIS — E559 Vitamin D deficiency, unspecified: Secondary | ICD-10-CM | POA: Diagnosis not present

## 2022-12-11 LAB — COMPREHENSIVE METABOLIC PANEL
ALT: 14 U/L (ref 0–35)
AST: 19 U/L (ref 0–37)
Albumin: 4.1 g/dL (ref 3.5–5.2)
Alkaline Phosphatase: 54 U/L (ref 39–117)
BUN: 12 mg/dL (ref 6–23)
CO2: 27 meq/L (ref 19–32)
Calcium: 9.5 mg/dL (ref 8.4–10.5)
Chloride: 106 meq/L (ref 96–112)
Creatinine, Ser: 0.64 mg/dL (ref 0.40–1.20)
GFR: 117.83 mL/min (ref >60.00)
Glucose, Bld: 94 mg/dL (ref 70–99)
Potassium: 3.6 meq/L (ref 3.5–5.1)
Sodium: 141 meq/L (ref 135–145)
Total Bilirubin: 0.6 mg/dL (ref 0.2–1.2)
Total Protein: 6.9 g/dL (ref 6.0–8.3)

## 2022-12-11 LAB — LIPID PANEL
Cholesterol: 264 mg/dL — ABNORMAL HIGH (ref 0–200)
HDL: 115.6 mg/dL (ref >39.00)
LDL Cholesterol: 138 mg/dL — ABNORMAL HIGH (ref 0–99)
NonHDL: 148.06
Total CHOL/HDL Ratio: 2
Triglycerides: 50 mg/dL (ref 0.0–149.0)
VLDL: 10 mg/dL (ref 0.0–40.0)

## 2022-12-11 LAB — CBC WITH DIFFERENTIAL/PLATELET
Basophils Absolute: 0 10*3/uL (ref 0.0–0.1)
Basophils Relative: 0.4 % (ref 0.0–3.0)
Eosinophils Absolute: 0 10*3/uL (ref 0.0–0.7)
Eosinophils Relative: 1 % (ref 0.0–5.0)
HCT: 39.4 % (ref 36.0–46.0)
Hemoglobin: 13 g/dL (ref 12.0–15.0)
Lymphocytes Relative: 51.3 % — ABNORMAL HIGH (ref 12.0–46.0)
Lymphs Abs: 2.1 10*3/uL (ref 0.7–4.0)
MCHC: 33.1 g/dL (ref 30.0–36.0)
MCV: 95.1 fl (ref 78.0–100.0)
Monocytes Absolute: 0.3 10*3/uL (ref 0.1–1.0)
Monocytes Relative: 8.1 % (ref 3.0–12.0)
Neutro Abs: 1.6 10*3/uL (ref 1.4–7.7)
Neutrophils Relative %: 39.2 % — ABNORMAL LOW (ref 43.0–77.0)
Platelets: 307 10*3/uL (ref 150.0–400.0)
RBC: 4.14 Mil/uL (ref 3.87–5.11)
RDW: 12.7 % (ref 11.5–15.5)
WBC: 4 10*3/uL (ref 4.0–10.5)

## 2022-12-11 LAB — VITAMIN D 25 HYDROXY (VIT D DEFICIENCY, FRACTURES): VITD: 33.53 ng/mL (ref 30.00–100.00)

## 2022-12-11 LAB — TSH: TSH: 0.65 u[IU]/mL (ref 0.35–5.50)

## 2022-12-11 NOTE — Progress Notes (Signed)
White blood cell count is stable overall. Slight abnormality in differentials, but we can keep an eye on this, likely recent viral infection.   Cholesterol is slightly elevated. Lifestyle factors for lowering cholesterol include: Diet therapy - heart-healthy diet rich in fruits, veggies, fiber-rich whole grains, lean meats, chicken, fish (at least twice a week), fat-free or 1% dairy products; foods low in saturated/trans fats, cholesterol, sodium, and sugar. Mediterranean diet has shown to be very heart healthy. Regular exercise - recommend at least 30 minutes a day, 5 times per week Weight management   Other labs are stable!

## 2022-12-11 NOTE — Progress Notes (Signed)
Complete physical exam  Patient: Joyce Stewart   DOB: 06-05-1990   32 y.o. Female  MRN: [id number]  Subjective:    No chief complaint on file.   Joyce Stewart is a 32 y.o. female who presents today to establish care and for a complete physical exam. She reports consuming a general diet. Home exercise routine includes walking, strength training. She generally feels well. She reports sleeping well. She does not have additional problems to discuss today.   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   Currently lives with: spouse and baby girl Acute concerns or interim problems since last visit: none  - History of Vit d deficiency. No current supplement.   Vision concerns: no Dental concerns: no STD concerns: no   Patient endorses occasional ETOH use. Patient denies nicotine use. Patient denies illegal substance use.    Females:  She is currently  sexually active  She denies  concerns today about STI Contraception choices are: Phexxi  LMP: no yet resumed, breastfeeding      Most recent fall risk assessment:    12/11/2022    9:24 AM  Fall Risk   Falls in the past year? 0  Number falls in past yr: 0  Injury with Fall? 0  Risk for fall due to : No Fall Risks  Follow up Falls evaluation completed     Most recent depression screenings:    12/11/2022    9:24 AM 02/28/2022    9:02 AM  PHQ 2/9 Scores  PHQ - 2 Score 0 0  PHQ- 9 Score 1 0            Patient Care Team: Clayborne Dana, NP as PCP - General (Family Medicine)   Outpatient Medications Prior to Visit  Medication Sig   Cholecalciferol (VITAMIN D) 10 MCG/ML LIQD    Lactic Ac-Citric Ac-Pot Bitart (PHEXXI) 1.8-1-0.4 % GEL Place 1 Dose vaginally as needed.   Omega 3 1200 MG CAPS Take by mouth.   Prenatal Vit-Fe Fumarate-FA (MULTIVITAMIN-PRENATAL) 27-0.8 MG TABS tablet Take 1 tablet by mouth daily at 12 noon.   No  facility-administered medications prior to visit.    ROS All review of systems negative except what is listed in the HPI        Objective:     BP 100/62   Pulse 70   Ht 5\' 4"  (1.626 m)   Wt 121 lb (54.9 kg)   SpO2 100%   BMI 20.77 kg/m    Physical Exam Vitals reviewed.  Constitutional:      General: She is not in acute distress.    Appearance: Normal appearance. She is not ill-appearing.  HENT:     Head: Normocephalic and atraumatic.     Right Ear: Tympanic membrane normal.     Left Ear: Tympanic membrane normal.     Nose: Nose normal.     Mouth/Throat:     Mouth: Mucous membranes are moist.     Pharynx: Oropharynx is clear.  Eyes:     Extraocular Movements: Extraocular movements intact.     Conjunctiva/sclera: Conjunctivae normal.     Pupils: Pupils are equal, round, and reactive to light.  Neck:     Vascular: No carotid bruit.  Cardiovascular:     Rate and Rhythm: Normal rate and regular rhythm.     Pulses: Normal pulses.     Heart sounds: Normal heart sounds.  Pulmonary:     Effort: Pulmonary effort is normal.     Breath sounds: Normal breath sounds.  Abdominal:     General: Abdomen is flat. Bowel sounds are normal. There is no distension.     Palpations: Abdomen is soft. There is no mass.     Tenderness: There is no abdominal tenderness. There is no right CVA tenderness, left CVA tenderness, guarding or rebound.  Genitourinary:    Comments: Deferred exam Musculoskeletal:        General: Normal range of motion.     Cervical back: Normal range of motion and neck supple. No tenderness.     Right lower leg: No edema.     Left lower leg: No edema.  Lymphadenopathy:     Cervical: No cervical adenopathy.  Skin:    General: Skin is warm and dry.     Capillary Refill: Capillary refill takes less than 2 seconds.  Neurological:     General: No focal deficit present.     Mental Status: She is alert and oriented to person, place, and time. Mental status is at  baseline.  Psychiatric:        Mood and Affect: Mood normal.        Behavior: Behavior normal.        Thought Content: Thought content normal.        Judgment: Judgment normal.          No results found for any visits on 12/11/22.     Assessment & Plan:    Routine Health Maintenance and Physical Exam Discussed health promotion and safety including diet and exercise recommendations, dental health, and injury prevention. Tobacco cessation if applicable. Seat belts, sunscreen, smoke detectors, etc.    Immunization History  Administered Date(s) Administered   MMR 05/13/2022   PFIZER(Purple Top)SARS-COV-2 Vaccination 06/01/2019, 06/24/2019   Tdap 02/28/2022    Health Maintenance  Topic Date Due   INFLUENZA VACCINE  07/02/2023 (Originally 11/02/2022)   COVID-19 Vaccine (3 - 2023-24 season) 12/08/2023 (Originally 12/03/2022)   PAP SMEAR-Modifier  10/25/2024   DTaP/Tdap/Td (2 - Td or Tdap) 02/29/2032   Hepatitis C Screening  Completed   HIV Screening  Completed   HPV VACCINES  Aged Out        Problem List Items Addressed This Visit   None Visit Diagnoses     Annual physical exam    -  Primary   Relevant Orders   CBC with Differential/Platelet   Comprehensive metabolic panel   Lipid panel   TSH   Vitamin D (25 hydroxy)   Encounter for medical examination to establish care       Vitamin D deficiency       Relevant Orders   Vitamin D (25 hydroxy)      Return in about 1 year (around 12/11/2023) for physical.     Clayborne Dana, NP

## 2022-12-11 NOTE — Patient Instructions (Signed)
Thank you for choosing [facility] for your Primary Care needs. I am excited for the opportunity to partner with you to meet your health care goals. It was a pleasure meeting you today!  Information on diet, exercise, and health maintenance recommendations are listed below. This is information to help you be sure you are on track for optimal health and monitoring.   Please look over this and let us know if you have any questions or if you have completed any of the health maintenance outside of [place] so that we can be sure your records are up to date.  ___________________________________________________________  MyChart:  For all urgent or time sensitive needs we ask that you please call the office to avoid delays. Our number is [phone]. MyChart is not constantly monitored and due to the large volume of messages a day, replies may take up to 72 business hours.  MyChart Policy: MyChart allows for you to see your visit notes, after visit summary, provider recommendations, lab and tests results, make an appointment, request refills, and contact your provider or the office for non-urgent questions or concerns. Providers are seeing patients during normal business hours and do not have built in time to review MyChart messages.  We ask that you allow a minimum of 3 business days for responses to MyChart messages. For this reason, please do not send urgent requests through MyChart. Please call the office at [phone]. New and ongoing conditions may require a visit. We have virtual and in-person visits available for your convenience.  Complex MyChart concerns may require a visit. Your provider may request you schedule a virtual or in-person visit to ensure we are providing the best care possible. MyChart messages sent after 11:00 AM on Friday will not be received by the provider until Monday morning.    Lab and Test Results: You will receive your lab and test  results on MyChart as soon as they are completed and results have been sent by the lab or testing facility. Due to this service, you will receive your results BEFORE your provider.  I review lab and test results each morning prior to seeing patients. Some results require collaboration with other providers to ensure you are receiving the most appropriate care. For this reason, we ask that you please allow a minimum of 3-5 business days from the time that ALL results have been received for your provider to receive and review lab and test results and contact you about these.  Most lab and test result comments from the provider will be sent through MyChart. Your provider may recommend changes to the plan of care, follow-up visits, repeat testing, ask questions, or request an office visit to discuss these results. You may reply directly to this message or call the office to provide information for the provider or set up an appointment. In some instances, you will be called with test results and recommendations. Please let us know if this is preferred and we will make note of this in your chart to provide this for you.    If you have not heard a response to your lab or test results in 5 business days from all results returning to MyChart, please call the office to let us know. We ask that you please avoid calling prior to this time unless there is an emergent concern. Due to high call volumes, this can delay the resulting process.  After Hours: For all non-emergency after hours needs, please   call the office at [phone] and select the option to reach the on-call  service. On-call services are shared between multiple [facility] offices and therefore it will not be possible to speak directly with your provider. On-call providers may provide medical advice and recommendations, but are unable to provide refills for maintenance medications.  For all emergency or urgent medical needs after normal business hours, we  recommend that you seek care at the closest Urgent Care or Emergency Department to ensure appropriate treatment in a timely manner.  [facility] has a 24 hour emergency room located on the ground floor for your convenience.   Urgent Concerns During the Business Day Providers are seeing patients from 8AM to 5PM with a busy schedule and are most often not able to respond to non-urgent calls until the end of the day or the next business day. If you should have URGENT concerns during the day, please call and speak to the nurse or schedule a same day appointment so that we can address your concern without delay.   Thank you, again, for choosing me as your health care partner. I appreciate your trust and look forward to learning more about you!   Taylor B. Beck, DNP, FNP-C  ___________________________________________________________  Health Maintenance Recommendations Screening Testing Mammogram Every 1-2 years based on history and risk factors Starting at age 50 Pap Smear Ages 21-39 every 3 years Ages 30-65 every 5 years with HPV testing More frequent testing may be required based on results and history Colon Cancer Screening Every 1-10 years based on test performed, risk factors, and history Starting at age 45 Bone Density Screening Every 2-10 years based on history Starting at age 65 for women Recommendations for men differ based on medication usage, history, and risk factors AAA Screening One time ultrasound Men 65-75 years old who have ever smoked Lung Cancer Screening Low Dose Lung CT every 12 months Age 50-80 years with a 20 pack-year smoking history who still smoke or who have quit within the last 15 years  Screening Labs Routine  Labs: Complete Blood Count (CBC), Complete Metabolic Panel (CMP), Cholesterol (Lipid Panel) Every 6-12 months based on history and medications May be recommended more frequently based on current conditions or previous results Hemoglobin  A1c Lab Every 3-12 months based on history and previous results Starting at age 45 or earlier with diagnosis of diabetes, high cholesterol, BMI >26, and/or risk factors Frequent monitoring for patients with diabetes to ensure blood sugar control Thyroid Panel  Every 6 months based on history, symptoms, and risk factors May be repeated more often if on medication HIV One time testing for all patients 13 and older May be repeated more frequently for patients with increased risk factors or exposure Hepatitis C One time testing for all patients 18 and older May be repeated more frequently for patients with increased risk factors or exposure Gonorrhea, Chlamydia Every 12 months for all sexually active persons 13-24 years Additional monitoring may be recommended for those who are considered high risk or who have symptoms PSA Men 40-54 years old with risk factors Additional screening may be recommended from age 55-69 based on risk factors, symptoms, and history  Vaccine Recommendations Tetanus Booster All adults every 10 years Flu Vaccine All patients 6 months and older every year COVID Vaccine All patients 12 years and older Initial dosing with booster May recommend additional booster based on age and health history HPV Vaccine 2 doses all patients age 9-26 Dosing may be considered   for patients over 26 Shingles Vaccine (Shingrix) 2 doses all adults 50 years and older Pneumonia (Pneumovax 23) All adults 65 years and older May recommend earlier dosing based on health history Pneumonia (Prevnar 13) All adults 65 years and older Dosed 1 year after Pneumovax 23 Pneumonia (Prevnar 20) All adults 65 years and older (adults 19-64 with certain conditions or risk factors) 1 dose  For those who have not received Prevnar 13 vaccine previously   Additional Screening, Testing, and Vaccinations may be recommended on an individualized basis based on family history, health history, risk  factors, and/or exposure.  __________________________________________________________  Diet Recommendations for All Patients  I recommend that all patients maintain a diet low in saturated fats, carbohydrates, and cholesterol. While this can be challenging at first, it is not impossible and small changes can make big differences.  Things to try: Decreasing the amount of soda, sweet tea, and/or juice to one or less per day and replace with water While water is always the first choice, if you do not like water you may consider adding a water additive without sugar to improve the taste other sugar free drinks Replace potatoes with a brightly colored vegetable  Use healthy oils, such as canola oil or olive oil, instead of butter or hard margarine Limit your bread intake to two pieces or less a day Replace regular pasta with low carb pasta options Bake, broil, or grill foods instead of frying Monitor portion sizes  Eat smaller, more frequent meals throughout the day instead of large meals  An important thing to remember is, if you love foods that are not great for your health, you don't have to give them up completely. Instead, allow these foods to be a reward when you have done well. Allowing yourself to still have special treats every once in a while is a nice way to tell yourself thank you for working hard to keep yourself healthy.   Also remember that every day is a new day. If you have a bad day and "fall off the wagon", you can still climb right back up and keep moving along on your journey!  We have resources available to help you!  Some websites that may be helpful include: www.MyPlate.gov  Www.VeryWellFit.com _____________________________________________________________  Activity Recommendations for All Patients  I recommend that all adults get at least 20 minutes of moderate physical activity that elevates your heart rate at least 5 days out of the week.  Some examples  include: Walking or jogging at a pace that allows you to carry on a conversation Cycling (stationary bike or outdoors) Water aerobics Yoga Weight lifting Dancing If physical limitations prevent you from putting stress on your joints, exercise in a pool or seated in a chair are excellent options.  Do determine your MAXIMUM heart rate for activity: 220 - YOUR AGE = MAX Heart Rate   Remember! Do not push yourself too hard.  Start slowly and build up your pace, speed, weight, time in exercise, etc.  Allow your body to rest between exercise and get good sleep. You will need more water than normal when you are exerting yourself. Do not wait until you are thirsty to drink. Drink with a purpose of getting in at least 8, 8 ounce glasses of water a day plus more depending on how much you exercise and sweat.    If you begin to develop dizziness, chest pain, abdominal pain, jaw pain, shortness of breath, headache, vision changes, lightheadedness, or other concerning symptoms,   stop the activity and allow your body to rest. If your symptoms are severe, seek emergency evaluation immediately. If your symptoms are concerning, but not severe, please let us know so that we can recommend further evaluation.     

## 2022-12-20 ENCOUNTER — Encounter: Payer: No Typology Code available for payment source | Admitting: Family Medicine

## 2022-12-20 ENCOUNTER — Encounter: Payer: Self-pay | Admitting: Family Medicine

## 2022-12-20 NOTE — Progress Notes (Unsigned)
Patient had baby in Feb 2024. Patient is currently still breastfeeding.

## 2022-12-21 NOTE — Progress Notes (Signed)
Patient had to leave before being seen

## 2023-01-11 ENCOUNTER — Other Ambulatory Visit (HOSPITAL_COMMUNITY)
Admission: RE | Admit: 2023-01-11 | Discharge: 2023-01-11 | Disposition: A | Discharge status: Home / Self Care | Payer: No Typology Code available for payment source | Source: Ambulatory Visit | Attending: Family Medicine | Admitting: Family Medicine

## 2023-01-11 ENCOUNTER — Encounter: Payer: Self-pay | Admitting: Obstetrics & Gynecology

## 2023-01-11 ENCOUNTER — Ambulatory Visit: Payer: No Typology Code available for payment source | Admitting: Obstetrics & Gynecology

## 2023-01-11 VITALS — BP 104/66 | HR 66 | Ht 63.0 in | Wt 119.0 lb

## 2023-01-11 DIAGNOSIS — Z01419 Encounter for gynecological examination (general) (routine) without abnormal findings: Secondary | ICD-10-CM | POA: Insufficient documentation

## 2023-01-11 DIAGNOSIS — R87612 Low grade squamous intraepithelial lesion on cytologic smear of cervix (LGSIL): Secondary | ICD-10-CM | POA: Diagnosis not present

## 2023-01-11 MED ORDER — PHEXXI 1.8-1-0.4 % VA GEL: 1.0000 | VAGINAL | 6 refills | Status: AC | PRN

## 2023-01-11 NOTE — Progress Notes (Signed)
Refill phexxi

## 2023-01-11 NOTE — Progress Notes (Signed)
GYNECOLOGY CLINIC ANNUAL PREVENTATIVE CARE ENCOUNTER NOTE  Subjective:   Joyce Stewart is a 32 y.o. G3P1021 female here for a routine annual gynecologic exam.  Current complaints: none.   Denies abnormal vaginal bleeding, discharge, pelvic pain, problems with intercourse or other gynecologic concerns.    Gynecologic History Patient's last menstrual period was 01/09/2023 (approximate). Contraception:  Phexxi Last Pap: 10/2021. Results were: abnormal Last mammogram: . Results were:   Obstetric History OB History  Gravida Para Term Preterm AB Living  3 1 1   2 1   SAB IAB Ectopic Multiple Live Births  1 1   0 1    # Outcome Date GA Lbr Len/2nd Weight Sex Type Anes PTL Lv  3 Term 05/11/22 [redacted]w[redacted]d 11:30 / 02:51 6 lb 8.4 oz (2.96 kg) F Vag-Spont EPI  LIV  2 SAB           1 IAB             Past Medical History:  Diagnosis Date   Chronic constipation 10/18/2017   Tendonitis of finger 05/15/2019   L thumb   Vaginal Pap smear, abnormal     No past surgical history on file.  Current Outpatient Medications on File Prior to Visit  Medication Sig Dispense Refill   Cholecalciferol (VITAMIN D) 10 MCG/ML LIQD      Omega 3 1200 MG CAPS Take by mouth.     Prenatal Vit-Fe Fumarate-FA (MULTIVITAMIN-PRENATAL) 27-0.8 MG TABS tablet Take 1 tablet by mouth daily at 12 noon.     No current facility-administered medications on file prior to visit.    No Known Allergies  Social History   Socioeconomic History   Marital status: Married    Spouse name: Ivin Booty   Number of children: Not on file   Years of education: Not on file   Highest education level: Not on file  Occupational History   Occupation: Magazine features editor: [employer]    Comment: 2nd grade  Tobacco Use   Smoking status: Never   Smokeless tobacco: Never  Vaping Use   Vaping status: Never Used  Substance and Sexual Activity   Alcohol use: Yes    Alcohol/week: 3.0 standard drinks of alcohol    Types: 1 Glasses  of wine, 2 Cans of beer per week   Drug use: Never   Sexual activity: Yes    Birth control/protection: Other-see comments    Comment: Phexi gel  Other Topics Concern   Not on file  Social History Narrative   Not on file   Social Determinants of Health   Financial Resource Strain: Not on file  Food Insecurity: Not on file  Transportation Needs: Not on file  Physical Activity: Not on file  Stress: Not on file  Social Connections: Not on file  Intimate Partner Violence: Not on file    Family History  Problem Relation Age of Onset   Cancer Father        prostate   Diabetes Neg Hx    Hyperlipidemia Neg Hx     The following portions of the patient's history were reviewed and updated as appropriate: allergies, current medications, past family history, past medical history, past social history, past surgical history and problem list.  Review of Systems Pertinent items are noted in HPI.   Objective:  BP 104/66   Pulse 66   Ht 5\' 3"  (1.6 m)   Wt 119 lb (54 kg)   LMP 01/09/2023 (Approximate) Comment: Spotting  Breastfeeding Yes   BMI 21.08 kg/m  CONSTITUTIONAL: Well-developed, well-nourished female in no acute distress.  HENT:  Normocephalic, atraumatic, External right and left ear normal. Oropharynx is clear and moist EYES: Conjunctivae and EOM are normal. Pupils are equal, round, and reactive to light. No scleral icterus.  NECK: Normal range of motion, supple, no masses.  Normal thyroid.  SKIN: Skin is warm and dry. No rash noted. Not diaphoretic. No erythema. No pallor. NEUROLGIC: Alert and oriented to person, place, and time. Normal reflexes, muscle tone coordination. No cranial nerve deficit noted. PSYCHIATRIC: Normal mood and affect. Normal behavior. Normal judgment and thought content. CARDIOVASCULAR: Normal heart rate noted, regular rhythm RESPIRATORY: Clear to auscultation bilaterally. Effort and breath sounds normal, no problems with respiration noted. BREASTS:  Symmetric in size. No masses, skin changes, nipple drainage, or lymphadenopathy. ABDOMEN: Soft, normal bowel sounds, no distention noted.  No tenderness, rebound or guarding.  PELVIC: Normal appearing external genitalia; normal appearing vaginal mucosa and cervix.  No abnormal discharge noted.  Pap smear obtained.  Normal uterine size, no other palpable masses, no uterine or adnexal tenderness. MUSCULOSKELETAL: Normal range of motion. No tenderness.  No cyanosis, clubbing, or edema.  2+ distal pulses.   Assessment:  Annual gynecologic examination with pap smear   Plan:  Will follow up results of pap smear and manage accordingly. Mammogram scheduled Routine preventative health maintenance measures emphasized. Please refer to After Visit Summary for other counseling recommendations.    Scheryl Darter, MD Attending Obstetrician & Gynecologist Center for [employer], [facility]

## 2023-01-15 DIAGNOSIS — F4320 Adjustment disorder, unspecified: Secondary | ICD-10-CM | POA: Diagnosis not present

## 2023-01-16 LAB — CYTOLOGY - PAP
Chlamydia: NEGATIVE
Comment: NEGATIVE
Comment: NEGATIVE
Comment: NEGATIVE
Comment: NEGATIVE
Comment: NEGATIVE
Comment: NORMAL
Diagnosis: UNDETERMINED — AB
HPV 16: NEGATIVE
HPV 18 / 45: POSITIVE — AB
High risk HPV: POSITIVE — AB
Neisseria Gonorrhea: NEGATIVE
Trichomonas: NEGATIVE

## 2023-01-21 NOTE — Progress Notes (Signed)
This patient needs a colposcopy for ASCUS pos HR HPV

## 2023-01-23 ENCOUNTER — Telehealth: Payer: Self-pay

## 2023-01-23 NOTE — Telephone Encounter (Signed)
Called patient to schedule a colpo. Patient is scheduled for a Colpo on 03/08/23. Understanding was voiced. Toshiye Kever l Henya Aguallo, CMA

## 2023-01-23 NOTE — Telephone Encounter (Signed)
-----   Message from Scheryl Darter sent at 01/21/2023 10:13 AM EDT ----- This patient needs a colposcopy for ASCUS pos HR HPV

## 2023-02-22 ENCOUNTER — Other Ambulatory Visit: Payer: Self-pay | Admitting: Medical Genetics

## 2023-02-22 DIAGNOSIS — Z006 Encounter for examination for normal comparison and control in clinical research program: Secondary | ICD-10-CM

## 2023-03-08 ENCOUNTER — Encounter: Payer: Self-pay | Admitting: Family Medicine

## 2023-03-08 ENCOUNTER — Other Ambulatory Visit (HOSPITAL_COMMUNITY)
Admission: RE | Admit: 2023-03-08 | Discharge: 2023-03-08 | Disposition: A | Discharge status: Home / Self Care | Payer: No Typology Code available for payment source | Source: Ambulatory Visit | Attending: Family Medicine | Admitting: Family Medicine

## 2023-03-08 ENCOUNTER — Ambulatory Visit: Payer: No Typology Code available for payment source | Admitting: Family Medicine

## 2023-03-08 VITALS — BP 93/59 | HR 69 | Wt 120.0 lb

## 2023-03-08 DIAGNOSIS — R87612 Low grade squamous intraepithelial lesion on cytologic smear of cervix (LGSIL): Secondary | ICD-10-CM | POA: Diagnosis not present

## 2023-03-08 DIAGNOSIS — Z3202 Encounter for pregnancy test, result negative: Secondary | ICD-10-CM

## 2023-03-08 DIAGNOSIS — R87810 Cervical high risk human papillomavirus (HPV) DNA test positive: Secondary | ICD-10-CM | POA: Diagnosis not present

## 2023-03-08 DIAGNOSIS — R87610 Atypical squamous cells of undetermined significance on cytologic smear of cervix (ASC-US): Secondary | ICD-10-CM | POA: Diagnosis not present

## 2023-03-08 DIAGNOSIS — F4320 Adjustment disorder, unspecified: Secondary | ICD-10-CM | POA: Diagnosis not present

## 2023-03-08 LAB — POCT URINE PREGNANCY: Preg Test, Ur: NEGATIVE

## 2023-03-08 NOTE — Progress Notes (Signed)
Patient Name: Joyce Stewart, female   DOB: 01-12-1991, 32 y.o.  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    Exam: Vulva and Vagina grossly normal.  Cervix viewed with speculum and colposcope after application of acetic acid:  Cervix Fully Visualized Squamocolumnar Junction Visibility: Fully visualized  Acetowhite lesions: none  Other Lesions: None Punctation: Not present  Mosaicism: Not present Abnormal vasculature: No   Biopsies: none ECC: Yes - Curette and Brush  Hemostasis achieved with:   n/a  Colposcopy Impression:  Benign   Patient was given post procedure instructions.  Will call patient with results.

## 2023-03-09 LAB — SURGICAL PATHOLOGY

## 2023-03-14 IMAGING — RF DG HYSTEROGRAM
7 series · 13 of 13 positions shown · IV contrast (omnipaque)
Comparison: None Available.

CLINICAL DATA: Encounter for fertility testing

EXAM:
HYSTEROSALPINGOGRAM
TECHNIQUE: Following cleansing of the cervix and vagina, a hysterosalpingogram
was performed using a 5 French hysterosalpingogram catheter and
Omnipaque 300 contrast. Catheterization was performed by OBGYN. The
patient tolerated the exam without difficulty. The fluoroscopy
portion of this exam was performed by Odelia Kornegay, NP, and was
supervised and interpreted by Newboll, Yarilin.
FLUOROSCOPY:
Radiation Exposure Index (as provided by the fluoroscopic device):
13.4 mGy Kerma

[Series 1: cp_standard · 0.17mm/px · 1 of 1 slices shown (1 of 7)]
[im 1/1]
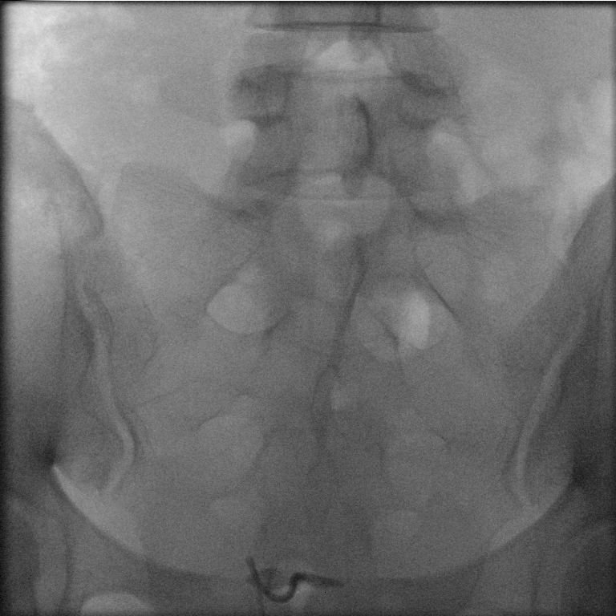

[Series 2: cp_standard · 0.17mm/px · 1 of 1 slices shown (2 of 7)]
[im 1/1]
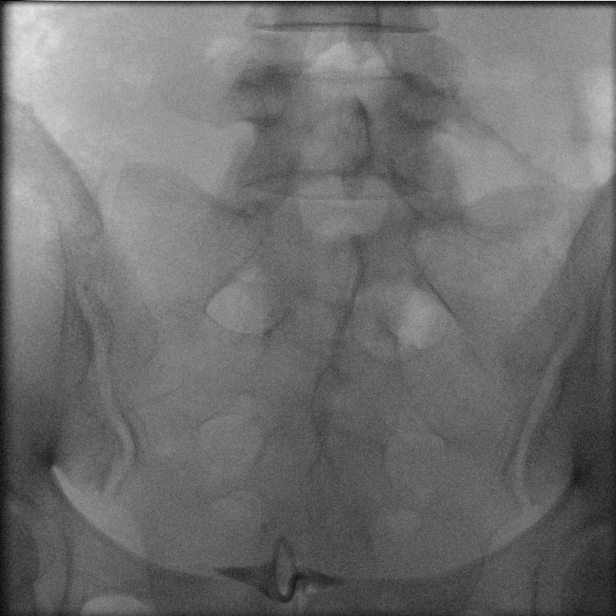

[Series 3: cp_standard · 0.17mm/px · 4 of 84 frames shown (3 of 7)]
[frame 13/84]
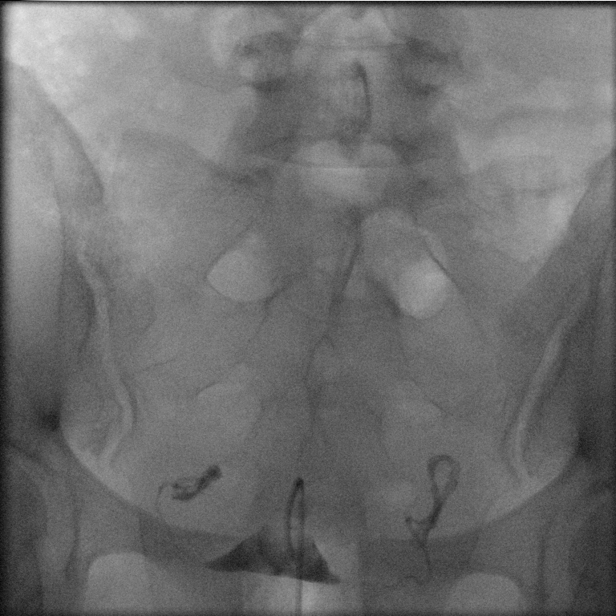
[frame 43/84]
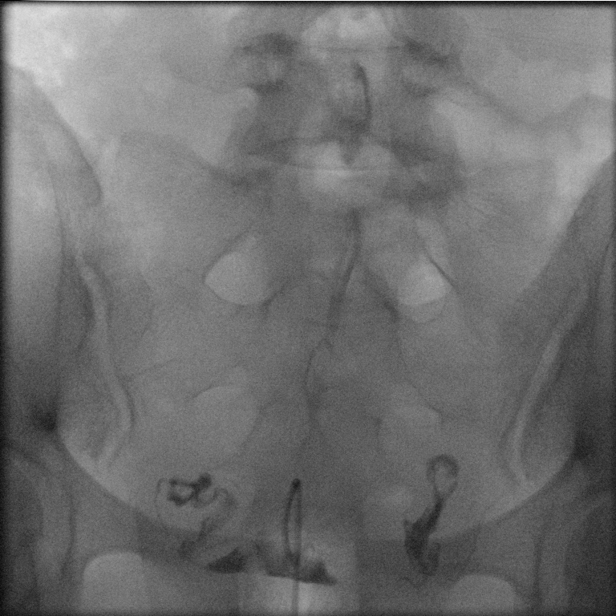
[frame 48/84]
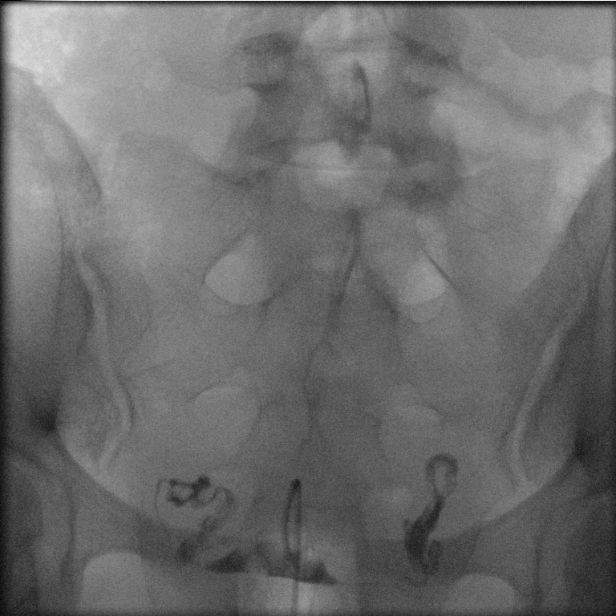
[frame 72/84]
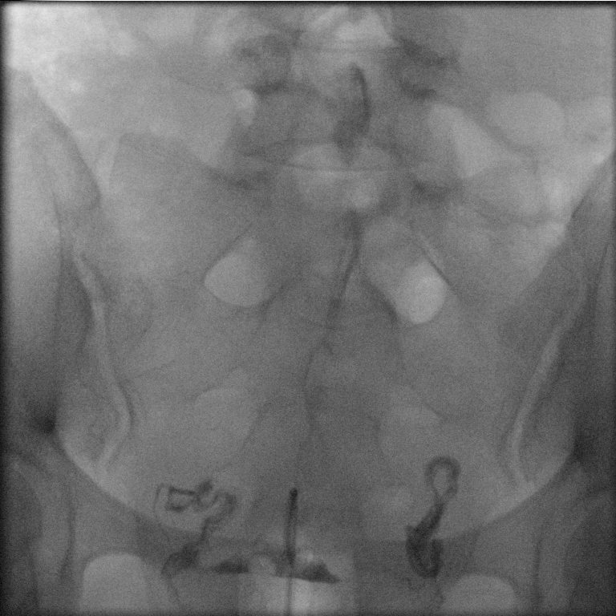

[Series 4: cp_standard · 0.17mm/px · 1 of 1 slices shown (4 of 7)]
[im 1/1]
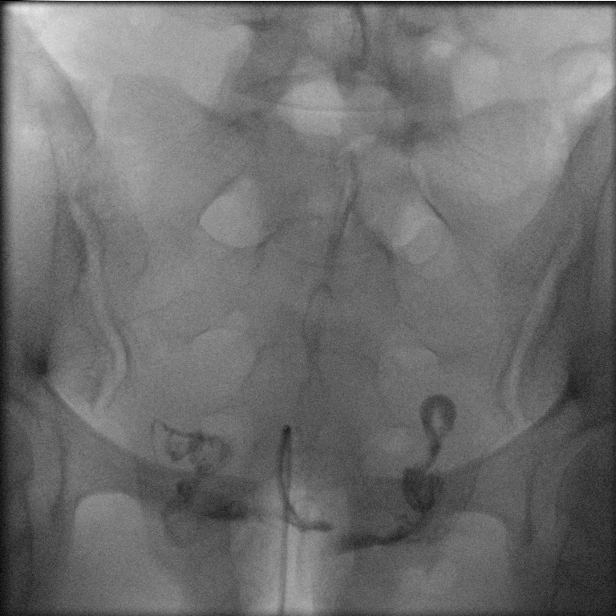

[Series 5: cp_standard · 0.17mm/px · 4 of 155 frames shown (5 of 7)]
[frame 24/155]
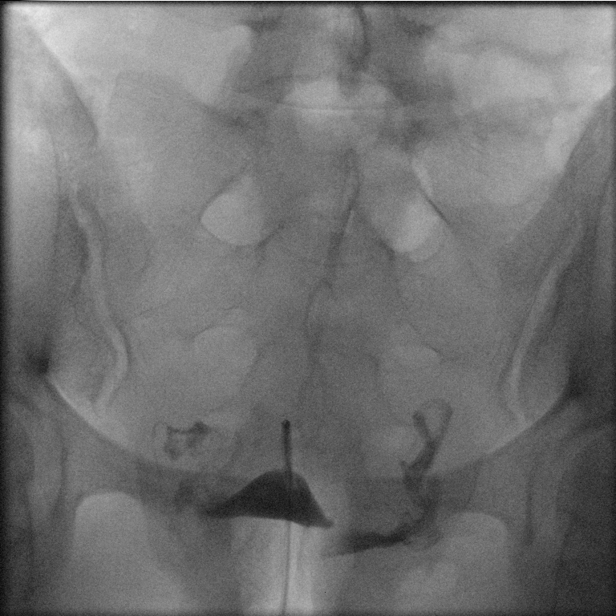
[frame 78/155]
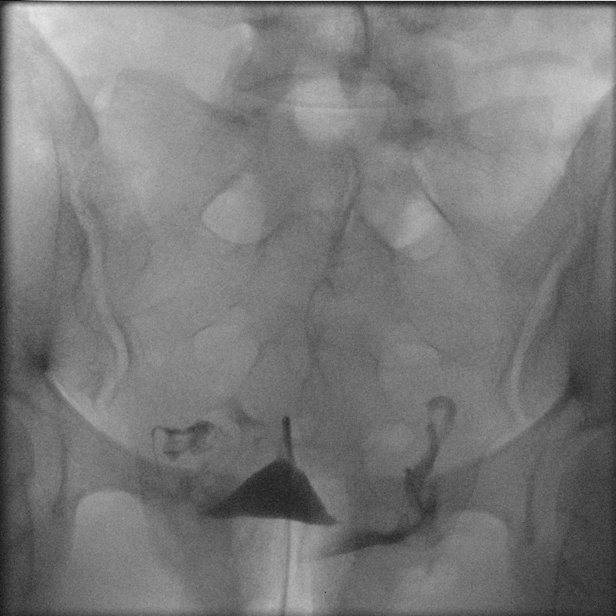
[frame 105/155]
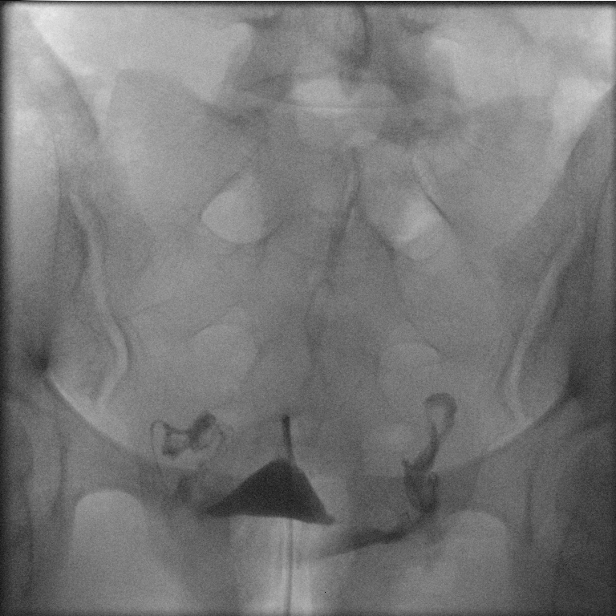
[frame 132/155]
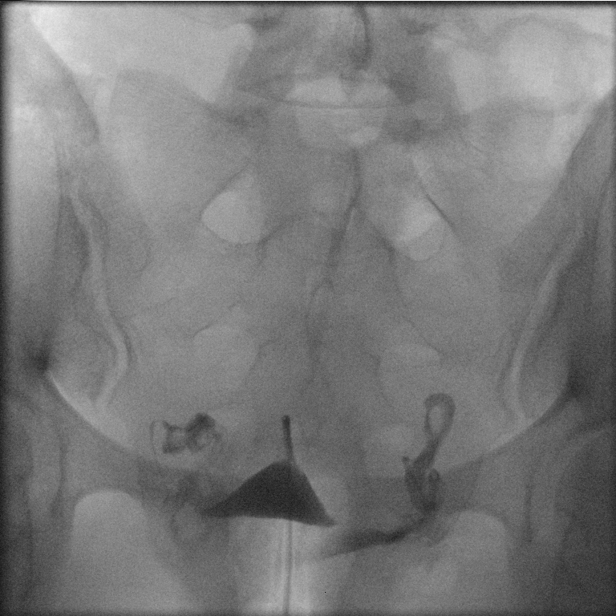

[Series 6: cp_standard · 0.17mm/px · 1 of 1 slices shown (6 of 7)]
[im 1/1]
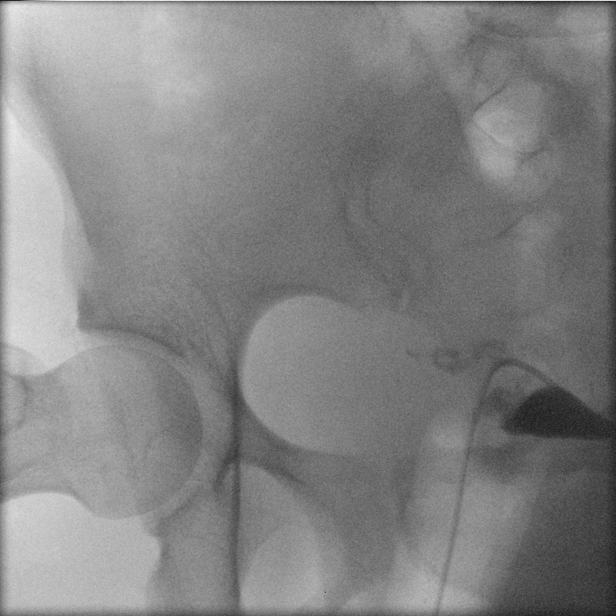

[Series 7: cp_standard · 0.17mm/px · 1 of 1 slices shown (7 of 7)]
[im 1/1]
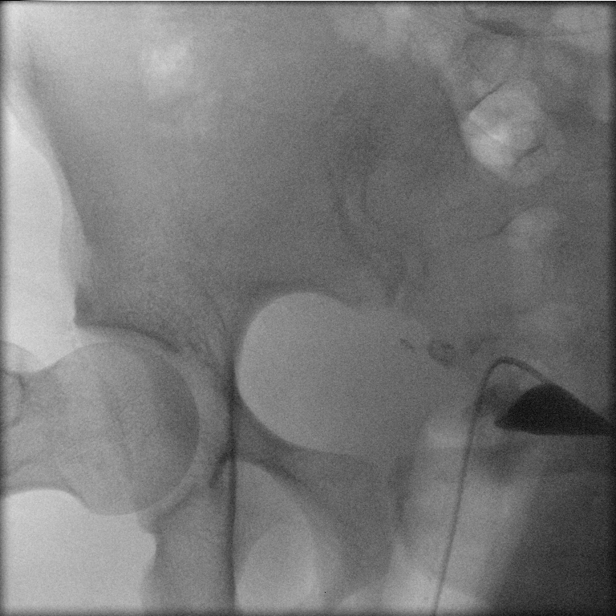

[13 of 13 positions shown; findings below may reference images not displayed]

FINDINGS: Endometrial Cavity: Normal appearance. No signs of Mullerian duct
anomaly or other significant abnormality.

Right Fallopian Tube: Normal appearance. Free intraperitoneal spill
of contrast is demonstrated.

Left Fallopian Tube: Normal appearance. Free intraperitoneal spill
of contrast is demonstrated.

Other: None.
IMPRESSION: Normal hysterosalpingogram.

## 2023-04-06 ENCOUNTER — Other Ambulatory Visit (HOSPITAL_COMMUNITY)
Admission: RE | Admit: 2023-04-06 | Discharge: 2023-04-06 | Disposition: A | Discharge status: Home / Self Care | Payer: Self-pay | Source: Ambulatory Visit | Attending: Medical Genetics | Admitting: Medical Genetics

## 2023-04-06 DIAGNOSIS — Z006 Encounter for examination for normal comparison and control in clinical research program: Secondary | ICD-10-CM | POA: Insufficient documentation

## 2023-04-16 DIAGNOSIS — F4320 Adjustment disorder, unspecified: Secondary | ICD-10-CM | POA: Diagnosis not present

## 2023-04-20 LAB — GENECONNECT MOLECULAR SCREEN: Genetic Analysis Overall Interpretation: NEGATIVE

## 2023-05-17 DIAGNOSIS — F4320 Adjustment disorder, unspecified: Secondary | ICD-10-CM | POA: Diagnosis not present

## 2023-06-25 DIAGNOSIS — F4320 Adjustment disorder, unspecified: Secondary | ICD-10-CM | POA: Diagnosis not present

## 2023-07-25 DIAGNOSIS — F4320 Adjustment disorder, unspecified: Secondary | ICD-10-CM | POA: Diagnosis not present

## 2023-08-20 DIAGNOSIS — F4310 Post-traumatic stress disorder, unspecified: Secondary | ICD-10-CM | POA: Diagnosis not present

## 2023-10-17 ENCOUNTER — Ambulatory Visit

## 2023-10-18 DIAGNOSIS — F4310 Post-traumatic stress disorder, unspecified: Secondary | ICD-10-CM | POA: Diagnosis not present

## 2023-10-24 ENCOUNTER — Ambulatory Visit

## 2023-10-24 VITALS — BP 105/62 | HR 78 | Ht 64.0 in | Wt 125.0 lb

## 2023-10-24 DIAGNOSIS — N912 Amenorrhea, unspecified: Secondary | ICD-10-CM

## 2023-10-24 LAB — POCT URINE PREGNANCY: Preg Test, Ur: POSITIVE — AB

## 2023-10-24 NOTE — Progress Notes (Signed)
 Joyce Stewart here for a UPT. Pt had a positive upt at home. LMP is 09/12/23.     UPT in office Positive.    Reviewed medications and informed to start a PNV, if not already. Pt to follow up in 3 weeks for New OB  intake visit.

## 2023-11-07 ENCOUNTER — Other Ambulatory Visit (HOSPITAL_COMMUNITY)
Admission: RE | Admit: 2023-11-07 | Discharge: 2023-11-07 | Disposition: A | Discharge status: Home / Self Care | Source: Ambulatory Visit | Attending: Family Medicine | Admitting: Family Medicine

## 2023-11-07 ENCOUNTER — Other Ambulatory Visit: Payer: Self-pay

## 2023-11-07 ENCOUNTER — Ambulatory Visit

## 2023-11-07 VITALS — Wt 128.0 lb

## 2023-11-07 DIAGNOSIS — Z3480 Encounter for supervision of other normal pregnancy, unspecified trimester: Secondary | ICD-10-CM

## 2023-11-07 NOTE — Progress Notes (Signed)
 New OB Intake  I explained I am completing New OB Intake today. We discussed EDD of 06/18/2024, by Last Menstrual Period. Pt is G4P1021. I reviewed her allergies, medications and Medical/Surgical/OB history.    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  Patient also informed that the ultrasound is not being completed with the intent of assessing for fetal or placental anomalies or any pelvic abnormalities. Explained that the purpose of today's ultrasound is to assess for viability.  Patient acknowledges the purpose of the exam and the limitations of the study.     Delivery Plans Plans to deliver at [facility]. Discussed the nature of our practice with multiple providers including residents and students. Due to the size of the practice, the delivering provider may not be the same as those providing prenatal care.   MyChart/Babyscripts MyChart access verified. I explained pt will have some visits in office and some virtually. Babyscripts app discussed and ordered.   Blood Pressure Cuff Blood pressure cuff discussedDiscussed to be used for virtual visits and or if needed BP checks weekly.  Anatomy US  Explained first scheduled US  will be around 19 weeks.   Last Pap Diagnosis  Date Value Ref Range Status  01/11/2023 (A)  Final   - Atypical squamous cells of undetermined significance (ASC-US )    First visit review I reviewed new OB appt with patient. Explained pt will be seen by Dr. Barbra at first visit. Discussed [place] genetic screening with patient.  Routine prenatal labs ordered.    Erminio DELENA Rumps, [place] 11/07/2023  9:31 AM

## 2023-11-08 ENCOUNTER — Ambulatory Visit: Payer: Self-pay | Admitting: Family Medicine

## 2023-11-08 DIAGNOSIS — Z3480 Encounter for supervision of other normal pregnancy, unspecified trimester: Secondary | ICD-10-CM

## 2023-11-08 LAB — CBC/D/PLT+RPR+RH+ABO+RUBIGG...
Antibody Screen: NEGATIVE
Basophils Absolute: 0 x10E3/uL (ref 0.0–0.2)
Basos: 0 %
EOS (ABSOLUTE): 0 x10E3/uL (ref 0.0–0.4)
Eos: 0 %
HCV Ab: NONREACTIVE
HIV Screen 4th Generation wRfx: NONREACTIVE
Hematocrit: 38 % (ref 34.0–46.6)
Hemoglobin: 12.6 g/dL (ref 11.1–15.9)
Hepatitis B Surface Ag: NEGATIVE
Immature Grans (Abs): 0 x10E3/uL (ref 0.0–0.1)
Immature Granulocytes: 0 %
Lymphocytes Absolute: 1.9 x10E3/uL (ref 0.7–3.1)
Lymphs: 27 %
MCH: 31.9 pg (ref 26.6–33.0)
MCHC: 33.2 g/dL (ref 31.5–35.7)
MCV: 96 fL (ref 79–97)
Monocytes Absolute: 0.6 x10E3/uL (ref 0.1–0.9)
Monocytes: 8 %
Neutrophils Absolute: 4.6 x10E3/uL (ref 1.4–7.0)
Neutrophils: 65 %
Platelets: 316 x10E3/uL (ref 150–450)
RBC: 3.95 x10E6/uL (ref 3.77–5.28)
RDW: 12.4 % (ref 11.7–15.4)
RPR Ser Ql: NONREACTIVE
Rh Factor: POSITIVE
Rubella Antibodies, IGG: 6.63 {index} (ref >0.99)
WBC: 7.1 x10E3/uL (ref 3.4–10.8)

## 2023-11-08 LAB — CERVICOVAGINAL ANCILLARY ONLY
Chlamydia: NEGATIVE
Comment: NEGATIVE
Comment: NORMAL
Neisseria Gonorrhea: NEGATIVE

## 2023-11-08 LAB — HCV INTERPRETATION

## 2023-11-12 LAB — URINE CULTURE, OB REFLEX

## 2023-11-12 LAB — CULTURE, OB URINE

## 2023-11-14 ENCOUNTER — Encounter: Payer: Self-pay | Admitting: Family Medicine

## 2023-11-21 ENCOUNTER — Ambulatory Visit (INDEPENDENT_AMBULATORY_CARE_PROVIDER_SITE_OTHER)

## 2023-11-21 ENCOUNTER — Other Ambulatory Visit: Payer: Self-pay

## 2023-11-21 ENCOUNTER — Other Ambulatory Visit: Payer: Self-pay | Admitting: Family Medicine

## 2023-11-21 DIAGNOSIS — Z3480 Encounter for supervision of other normal pregnancy, unspecified trimester: Secondary | ICD-10-CM

## 2023-11-21 DIAGNOSIS — Z3A11 11 weeks gestation of pregnancy: Secondary | ICD-10-CM | POA: Diagnosis not present

## 2023-11-21 DIAGNOSIS — Z3481 Encounter for supervision of other normal pregnancy, first trimester: Secondary | ICD-10-CM

## 2023-11-22 ENCOUNTER — Other Ambulatory Visit (HOSPITAL_COMMUNITY)
Admission: RE | Admit: 2023-11-22 | Discharge: 2023-11-22 | Disposition: A | Discharge status: Home / Self Care | Source: Ambulatory Visit | Attending: Family Medicine | Admitting: Family Medicine

## 2023-11-22 ENCOUNTER — Ambulatory Visit (INDEPENDENT_AMBULATORY_CARE_PROVIDER_SITE_OTHER): Admitting: Family Medicine

## 2023-11-22 VITALS — BP 107/59 | HR 79 | Wt 131.1 lb

## 2023-11-22 DIAGNOSIS — Z3A10 10 weeks gestation of pregnancy: Secondary | ICD-10-CM

## 2023-11-22 DIAGNOSIS — R87612 Low grade squamous intraepithelial lesion on cytologic smear of cervix (LGSIL): Secondary | ICD-10-CM

## 2023-11-22 DIAGNOSIS — Z3480 Encounter for supervision of other normal pregnancy, unspecified trimester: Secondary | ICD-10-CM | POA: Diagnosis not present

## 2023-11-22 DIAGNOSIS — Z3481 Encounter for supervision of other normal pregnancy, first trimester: Secondary | ICD-10-CM | POA: Diagnosis not present

## 2023-11-22 DIAGNOSIS — Z3491 Encounter for supervision of normal pregnancy, unspecified, first trimester: Secondary | ICD-10-CM | POA: Diagnosis not present

## 2023-11-23 LAB — HEMOGLOBIN A1C
Est. average glucose Bld gHb Est-mCnc: 108 mg/dL
Hgb A1c MFr Bld: 5.4 % (ref 4.8–5.6)

## 2023-11-23 NOTE — Progress Notes (Signed)
 Subjective:  Joyce Stewart is a [id number] [redacted]w[redacted]d being seen today for her first obstetrical visit.  Her obstetrical history is significant for normal SVD. Patient does intend to breast feed. Pregnancy history fully reviewed.  Patient reports nausea.  BP (!) 107/59   Pulse 79   Wt 131 lb 1.3 oz (59.5 kg)   LMP 09/12/2023   BMI 22.50 kg/m   HISTORY: OB History  Gravida Para Term Preterm AB Living  4 1 1  2 1   SAB IAB Ectopic Multiple Live Births  1 1  0 1    # Outcome Date GA Lbr Len/2nd Weight Sex Type Anes PTL Lv  4 Current           3 Term 05/11/22 [redacted]w[redacted]d 11:30 / 02:51 6 lb 8.4 oz (2.96 kg) F Vag-Spont EPI  LIV  2 SAB           1 IAB             Past Medical History:  Diagnosis Date   Chronic constipation 10/18/2017   Tendonitis of finger 05/15/2019   L thumb   Vaginal Pap smear, abnormal     Past Surgical History:  Procedure Laterality Date   NO PAST SURGERIES      Family History  Problem Relation Age of Onset   Hyperlipidemia Mother    Cancer Father        prostate   Hyperlipidemia Father    Diabetes Neg Hx      Exam  BP (!) 107/59   Pulse 79   Wt 131 lb 1.3 oz (59.5 kg)   LMP 09/12/2023   BMI 22.50 kg/m   Chaperone present during exam  CONSTITUTIONAL: Well-developed, well-nourished female in no acute distress.  HENT:  Normocephalic, atraumatic, External right and left ear normal. Oropharynx is clear and moist EYES: Conjunctivae and EOM are normal. Pupils are equal, round, and reactive to light. No scleral icterus.  NECK: Normal range of motion, supple, no masses.  Normal thyroid.  CARDIOVASCULAR: Normal heart rate noted, regular rhythm RESPIRATORY: Clear to auscultation bilaterally. Effort and breath sounds normal, no problems with respiration noted. BREASTS: deferred ABDOMEN: Soft, normal bowel sounds, no distention noted.  No tenderness, rebound or guarding.  PELVIC: Normal appearing external genitalia; normal appearing vaginal mucosa and  cervix. No abnormal discharge noted. Normal uterine size, no other palpable masses, no uterine or adnexal tenderness. MUSCULOSKELETAL: Normal range of motion. No tenderness.  No cyanosis, clubbing, or edema.  2+ distal pulses. SKIN: Skin is warm and dry. No rash noted. Not diaphoretic. No erythema. No pallor. NEUROLOGIC: Alert and oriented to person, place, and time. Normal reflexes, muscle tone coordination. No cranial nerve deficit noted. PSYCHIATRIC: Normal mood and affect. Normal behavior. Normal judgment and thought content.    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 [redacted] weeks gestation of pregnancy (Primary) - Cytology - PAP( [place]) - HORIZON CUSTOM - PANORAMA PRENATAL TEST - Hemoglobin A1c - US  MFM OB COMP + 14 WK; Future  2. Supervision of other normal pregnancy, antepartum FHT normal on US  - US  MFM OB COMP + 14 WK; Future  3. LGSIL on Pap smear of cervix  on 10/25/21 Repeat PAP   Problem list reviewed and updated. 75% of 30 min visit spent on counseling and coordination of care.     Keymarion Bearman J Deyra Perdomo 11/23/2023

## 2023-11-27 DIAGNOSIS — F4320 Adjustment disorder, unspecified: Secondary | ICD-10-CM | POA: Diagnosis not present

## 2023-11-29 LAB — CYTOLOGY - PAP
Comment: NEGATIVE
Comment: NEGATIVE
Comment: NEGATIVE
HPV 18 / 45: NEGATIVE
High risk HPV: POSITIVE — AB

## 2023-11-30 LAB — PANORAMA PRENATAL TEST FULL PANEL:PANORAMA TEST PLUS 5 ADDITIONAL MICRODELETIONS: FETAL FRACTION: 13.5

## 2023-12-03 LAB — HORIZON CUSTOM: REPORT SUMMARY: NEGATIVE

## 2023-12-17 ENCOUNTER — Ambulatory Visit (INDEPENDENT_AMBULATORY_CARE_PROVIDER_SITE_OTHER): Admitting: Obstetrics and Gynecology

## 2023-12-17 VITALS — BP 93/53 | HR 82 | Wt 134.1 lb

## 2023-12-17 DIAGNOSIS — R87612 Low grade squamous intraepithelial lesion on cytologic smear of cervix (LGSIL): Secondary | ICD-10-CM

## 2023-12-17 DIAGNOSIS — R8271 Bacteriuria: Secondary | ICD-10-CM

## 2023-12-17 DIAGNOSIS — Z3480 Encounter for supervision of other normal pregnancy, unspecified trimester: Secondary | ICD-10-CM | POA: Diagnosis not present

## 2023-12-17 DIAGNOSIS — Z3A13 13 weeks gestation of pregnancy: Secondary | ICD-10-CM

## 2023-12-17 NOTE — Progress Notes (Signed)
   PRENATAL VISIT NOTE  Subjective:  Joyce Stewart is a 33 y.o. G4P1021 at [redacted]w[redacted]d being seen today for ongoing prenatal care.  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  Patient reports occasional pain on her right side, feels like it could be her cyst.  Contractions: Not present. Vag. Bleeding: None.   . Denies leaking of fluid.   The following portions of the patient's history were reviewed and updated as appropriate: allergies, current medications, past family history, past medical history, past social history, past surgical history and problem list.   Objective:   Vitals:   12/17/23 1410  BP: (!) 93/53  Pulse: 82  Weight: 134 lb 1.9 oz (60.8 kg)   Body mass index is 23.02 kg/m. Total weight gain: 14 lb 1.9 oz (6.405 kg)   Fetal Status: Fetal Heart Rate (bpm): 163         General:  Alert, oriented and cooperative. Patient is in no acute distress.  Skin: Skin is warm and dry. No rash noted.   Cardiovascular: Normal heart rate noted  Respiratory: Normal respiratory effort, no problems with respiration noted  Abdomen: Soft, gravid, appropriate for gestational age.  Pain/Pressure: Absent     Pelvic: Cervical exam deferred        Extremities: Normal range of motion.  Edema: None  Mental Status: Normal mood and affect. Normal behavior. Normal judgment and thought content.   Assessment and Plan:  Pregnancy: G4P1021 at [redacted]w[redacted]d 1. Supervision of other normal pregnancy, antepartum (Primary) Anticipatory guidance Requests pelvic PT, referral placed  2. LGSIL on Pap smear of cervix Discussed risks/benefits/alternatives of colpo antepartum vs postpartum. She chooses antepartum, will schedule  3. GBS bacteriuria Antibiotics during labor  4. [redacted]  weeks gestation of pregnancy   Preterm labor symptoms and general obstetric precautions including but not limited to vaginal bleeding, contractions, leaking of fluid and fetal movement were reviewed in detail with the patient. Please refer to After Visit Summary for other counseling recommendations.   Return in about 4 weeks (around 01/14/2024).  Future Appointments  Date Time Provider Department Center  01/17/2024  8:15 AM Barbra Lang PARAS, DO [facility] None  01/25/2024  1:00 PM [facility] PROVIDER 1 [facility]  01/25/2024  1:30 PM [facility] US5 [facility]  02/14/2024  2:10 PM Barbra Lang PARAS, DO [facility] None    Rollo ONEIDA Bring, MD

## 2024-01-16 ENCOUNTER — Encounter: Payer: Self-pay | Admitting: Physical Therapy

## 2024-01-16 ENCOUNTER — Ambulatory Visit: Attending: Obstetrics and Gynecology | Admitting: Physical Therapy

## 2024-01-16 ENCOUNTER — Other Ambulatory Visit: Payer: Self-pay

## 2024-01-16 DIAGNOSIS — Z3480 Encounter for supervision of other normal pregnancy, unspecified trimester: Secondary | ICD-10-CM | POA: Insufficient documentation

## 2024-01-16 DIAGNOSIS — O26892 Other specified pregnancy related conditions, second trimester: Secondary | ICD-10-CM | POA: Diagnosis not present

## 2024-01-16 DIAGNOSIS — R293 Abnormal posture: Secondary | ICD-10-CM | POA: Diagnosis not present

## 2024-01-16 DIAGNOSIS — R279 Unspecified lack of coordination: Secondary | ICD-10-CM | POA: Diagnosis not present

## 2024-01-16 DIAGNOSIS — M6281 Muscle weakness (generalized): Secondary | ICD-10-CM | POA: Diagnosis not present

## 2024-01-16 DIAGNOSIS — Z3A18 18 weeks gestation of pregnancy: Secondary | ICD-10-CM | POA: Diagnosis not present

## 2024-01-16 NOTE — Therapy (Signed)
 OUTPATIENT PHYSICAL THERAPY FEMALE PELVIC EVALUATION   Patient Name: Joyce Stewart MRN: [id number] DOB:04-27-1990, 33 y.o., female Today's Date: 01/16/2024  END OF SESSION:  PT End of Session - 01/16/24 1054     Visit Number 1    Number of Visits 12    Date for Recertification  04/17/24    Authorization Type [employer]    Authorization - Visit Number 1    PT Start Time 1015    PT Stop Time 1100    PT Time Calculation (min) 45 min    Activity Tolerance Patient tolerated treatment well    Behavior During Therapy [facility] for tasks assessed/performed          Past Medical History:  Diagnosis Date   Chronic constipation 10/18/2017   Tendonitis of finger 05/15/2019   L thumb   Vaginal Pap smear, abnormal    Past Surgical History:  Procedure Laterality Date   NO PAST SURGERIES     Patient Active Problem List   Diagnosis Date Noted   Supervision of other normal pregnancy, antepartum 11/07/2023   Indication for care or intervention in labor or delivery 05/11/2022   GBS bacteriuria 04/24/2022   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 12/26/2021    PCP: Almarie Waddell NOVAK, NP  REFERRING PROVIDER: Abigail Rollo DASEN, MD   REFERRING DIAG: Z34.80 (ICD-10-CM) - Supervision of other normal pregnancy, antepartum  THERAPY DIAG:  Muscle weakness (generalized)  Unspecified lack of coordination  Abnormal posture  Rationale for Evaluation and Treatment: Rehabilitation  ONSET DATE: 2025  SUBJECTIVE:                                                                                                                                                                                           SUBJECTIVE STATEMENT: Patient reports to PFPT after having success with pelvic PT during her first pregnancy. She is currently pregnant with her second baby (18 weeks), no major discomfort currently, she is feeling relatively comfortable.  Fluid intake: moderate water intake, coconut water   FUNCTIONAL  LIMITATIONS: no major functional limitations   PERTINENT HISTORY:  Medications for current condition: no Surgeries: no major surgical history  Other: no Sexual abuse: No  PAIN:  Are you having pain? No NPRS scale: 0/10  PRECAUTIONS: None  RED FLAGS: None   WEIGHT BEARING RESTRICTIONS: No  FALLS:  Has patient fallen in last 6 months? 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, seated for job   ACTIVITY LEVEL : walks 1.5 miles per day, tries to do strength training 1-2x/week (  dumbbells, yoga ball, yoga mat, bands)   PLOF: Independent with basic ADLs  PATIENT GOALS: support for the rest of the pregnancy and a baseline for recovering postpartum   BOWEL MOVEMENT: Pain with bowel movement: No Type of bowel movement:Type (Bristol Stool Scale) 3-4, Frequency every other day - always been this way, Strain no, and Splinting no Fully empty rectum: Yes: depends on the day Leakage: No                                                     Caused by: no  Pads: No Fiber supplement/laxative No  URINATION: Pain with urination: No Fully empty bladder: Yes:                                   Post-void dribble: No Stream: Strong Urgency: Yes  Frequency:during the day within normal limits                                                           Nocturia: No   Leakage: every once in a while when urgency is high  Pads/briefs: No  INTERCOURSE:  Ability to have vaginal penetration Yes  Pain with intercourse: Initial Penetration Dryness: Yes  Climax: yes Marinoff Scale: 1/3 Lubricant: yes   PREGNANCY: Vaginal deliveries 1 Tearing Yes: minimal tearing with stitches  Episiotomy No C-section deliveries 0 Currently pregnant Yes: 18 weeks currently   PROLAPSE: None   OBJECTIVE:  Note: Objective measures were completed at Evaluation unless otherwise noted.  PATIENT SURVEYS:  PFIQ-7: 15  COGNITION: Overall  cognitive status: Within functional limits for tasks assessed     SENSATION: Light touch: Appears intact  LUMBAR SPECIAL TESTS:  Single leg stance test: Positive  FUNCTIONAL TESTS:  Single leg stance:  Rt: +  Lt: +  Sit-up test: 1/3  Squat: within normal limits  Bed mobility: within normal limits   GAIT: Assistive device utilized: None Comments: mild trendelenburg gait pattern with ambulation   POSTURE: rounded shoulders, forward head, and increased thoracic kyphosis  LUMBARAROM/PROM: within normal limits for all motions with no pain bilaterally   A/PROM A/PROM  Eval (% available)  Flexion   Extension   Right lateral flexion   Left lateral flexion   Right rotation   Left rotation    (Blank rows = not tested)  LOWER EXTREMITY ROM: within normal limits for all motions bilaterally with no pain   LOWER EXTREMITY MMT:  MMT Right eval Left eval  Hip flexion 4 4  Hip extension 3 3  Hip abduction 3 3  Hip adduction 4 4  Hip internal rotation 3 3  Hip external rotation 4 4  Knee flexion 4 4  Knee extension 4 4  Ankle dorsiflexion    Ankle plantarflexion    Ankle inversion    Ankle eversion     (Blank rows = not tested) PALPATION:  General: no tenderness to palpation of bilateral hip flexors or adductors   Pelvic Alignment: within normal limits mild anterior pelvic tilt   Abdominal: abdominal bracing at rest  Diastasis: Yes:  at and above umbilicus 2 finger widths  Distortion: No  Breathing: apical breathing pattern  Scar tissue: No                External Perineal Exam: moisture levels intact with sufficient clitoral hood mobility present                             Internal Pelvic Floor: Patient fully consents to today's internal vaginal examination. She wishes to get an understanding of her baseline pelvic floor function so that she can have optimal outcomes during pregnancy and postpartum. She demonstrates a well timed, moderately strong pelvic floor  contraction that is uncoordinated with her diaphragm. She utilizes full pelvic floor range of motion when she pairs a pelvic contraction with an exhale. No pain or palpable trigger points during today's exam.  Patient confirms identification and approves PT to assess internal pelvic floor and treatment Yes No emotional/communication barriers or cognitive limitation. Patient is motivated to learn. Patient understands and agrees with treatment goals and plan. PT explains patient will be examined in standing, sitting, and lying down to see how their muscles and joints work. When they are ready, they will be asked to remove their underwear so PT can examine their perineum. The patient is also given the option of providing their own chaperone as one is not provided in our facility. The patient also has the right and is explained the right to defer or refuse any part of the evaluation or treatment including the internal exam. With the patient's consent, PT will use one gloved finger to gently assess the muscles of the pelvic floor, seeing how well it contracts and relaxes and if there is muscle symmetry. After, the patient will get dressed and PT and patient will discuss exam findings and plan of care. PT and patient discuss plan of care, schedule, attendance policy and HEP activities.  PELVIC MMT:   MMT eval  Vaginal 3/5, 10 quick flicks, 6 second hold  Internal Anal Sphincter   External Anal Sphincter   Puborectalis   Diastasis Recti 2 fingers at and above umbilicus   (Blank rows = not tested)       TONE: Within normal limits bilaterally in superficial and deep pelvic floor musculature   PROLAPSE: None present in hooklying with cough test  TODAY'S TREATMENT:                                                                                                                              DATE:   EVAL 01/16/24: Examination completed, findings reviewed, pt educated on POC, HEP, and self care. Pt motivated  to participate in PT and agreeable to attempt recommendations.   Seated pelvic floor contraction + diaphragmatic breathing 3x10  Education to continue current walking and strength training regimen until early December   PATIENT EDUCATION:  Education details: relative anatomy and the connection between the  diaphragm and pelvic floor Person educated: Patient Education method: Explanation, Demonstration, Tactile cues, Verbal cues, and Handouts Education comprehension: verbalized understanding, returned demonstration, verbal cues required, tactile cues required, and needs further education  HOME EXERCISE PROGRAM: Access Code: [email or web address] URL: [email or web address] Date: 01/16/2024 Prepared by: Celena Domino  Exercises - Seated Pelvic Floor Contraction  - 1 x daily - 7 x weekly - 3 sets - 10 reps  Patient Education - Get To Know Your Pelvic Floor- Female  ASSESSMENT:  CLINICAL IMPRESSION: Patient is a 33 y.o. female  who was seen today for physical therapy evaluation and treatment for antepartum pelvic floor maintenance care during second pregnancy. She has had success in the past with PFPT after giving birth vaginally to her first child. She is not currently in any pain, but she wishes to understand more about her pelvic floor baseline strength and ways to prepare for labor and delivery in March. She present with full lumbar range of motion, mild hip and core weakness, and abdominal separation 2 finger widths above and at the umbilicus. Patient fully consents to today's internal vaginal examination. She wishes to get an understanding of her baseline pelvic floor function so that she can have optimal outcomes during pregnancy and postpartum. She demonstrates a well timed, moderately strong pelvic floor contraction that is uncoordinated with her diaphragm. She utilizes full pelvic floor range of motion when she pairs a pelvic contraction with an exhale. No pain or palpable trigger points  during today's exam. Pt tolerated session well and Pt would benefit from additional PT to further address deficits.    OBJECTIVE IMPAIRMENTS: decreased coordination, decreased endurance, decreased mobility, decreased ROM, and decreased strength.   ACTIVITY LIMITATIONS: carrying, lifting, bending, sitting, standing, squatting, stairs, transfers, bed mobility, continence, and toileting  PARTICIPATION LIMITATIONS: cleaning, laundry, interpersonal relationship, driving, shopping, community activity, and yard work  PERSONAL FACTORS: Age, Past/current experiences, and Time since onset of injury/illness/exacerbation are also affecting patient's functional outcome.   REHAB POTENTIAL: Good  CLINICAL DECISION MAKING: Stable/uncomplicated  EVALUATION COMPLEXITY: Low   GOALS: Goals reviewed with patient? Yes  SHORT TERM GOALS: Target date: 02/13/2024  Pt will be independent with HEP.  Baseline: Goal status: INITIAL  2.  Pt will be independent with use of squatty potty, relaxed toileting mechanics, and improved bowel movement techniques in order to increase ease of bowel movements and complete evacuation.  Baseline:  Goal status: INITIAL  3.  Pt will be independent with the knack, urge suppression technique, and double voiding in order to improve bladder habits and decrease urinary incontinence.  Baseline:  Goal status: INITIAL  LONG TERM GOALS: Target date: 07/16/2024  Pt will be independent with advanced HEP.  Baseline:  Goal status: INITIAL  2.  Pt to demonstrate improved coordination of pelvic floor and breathing mechanics with 10# squat with appropriate synergistic patterns to decrease pain and leakage at least 75% of the time for improved ability to complete a 30 minute workout with strain at pelvic floor and symptoms.   Baseline:  Goal status: INITIAL  3.  Pt will be able to correctly perform diaphragmatic breathing and appropriate pressure management in order to prevent  worsening vaginal wall laxity and improve pelvic floor A/ROM.  Baseline:  Goal status: INITIAL  PLAN:  PT FREQUENCY: 1-2x/week  PT DURATION: 6 months  PLANNED INTERVENTIONS: 97110-Therapeutic exercises, 97530- Therapeutic activity, 97112- Neuromuscular re-education, 97535- Self Care, [id number]- Manual therapy, Patient/Family education, Taping, Joint mobilization, Spinal mobilization, Scar mobilization, Cryotherapy, Moist heat, and Biofeedback  PLAN FOR NEXT SESSION: continued birth and labor prep, toileting mechanics for optimal emptying, core strengthening exercises   Celena JAYSON Domino, PT 01/16/2024, 11:00 AM [facility] [address] [place], [place] [address] Phone # [phone] Fax [phone]

## 2024-01-17 ENCOUNTER — Ambulatory Visit (INDEPENDENT_AMBULATORY_CARE_PROVIDER_SITE_OTHER): Admitting: Family Medicine

## 2024-01-17 VITALS — BP 100/51 | HR 76 | Wt 140.0 lb

## 2024-01-17 DIAGNOSIS — Z3480 Encounter for supervision of other normal pregnancy, unspecified trimester: Secondary | ICD-10-CM | POA: Diagnosis not present

## 2024-01-17 DIAGNOSIS — R8271 Bacteriuria: Secondary | ICD-10-CM

## 2024-01-17 DIAGNOSIS — Z3A18 18 weeks gestation of pregnancy: Secondary | ICD-10-CM | POA: Diagnosis not present

## 2024-01-17 DIAGNOSIS — R87612 Low grade squamous intraepithelial lesion on cytologic smear of cervix (LGSIL): Secondary | ICD-10-CM

## 2024-01-17 NOTE — Addendum Note (Signed)
 Addended by: BARBRA LANG PARAS on: 01/17/2024 08:52 AM   Modules accepted: Level of Service

## 2024-01-17 NOTE — Progress Notes (Signed)
 PRENATAL VISIT NOTE  Subjective:  Joyce Stewart is a 33 y.o. G4P1021 at [redacted]w[redacted]d being seen today for ongoing prenatal care.  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  Patient reports no complaints.  Contractions: Not present. Vag. Bleeding: None.  Movement: Present. Denies leaking of fluid.   The following portions of the patient's history were reviewed and updated as appropriate: allergies, current medications, past family history, past medical history, past social history, past surgical history and problem list.   Objective:    Vitals:   01/17/24 0825  BP: (!) 100/51  Pulse: 76  Weight: 140 lb (63.5 kg)    Fetal Status:  Fetal Heart Rate (bpm): 156   Movement: Present    General: Alert, oriented and cooperative. Patient is in no acute distress.  Skin: Skin is warm and dry. No rash noted.   Cardiovascular: Normal heart rate noted  Respiratory: Normal respiratory effort, no problems with respiration noted  Abdomen: Soft, gravid, appropriate for gestational age.  Pain/Pressure: Absent     Pelvic: Cervical exam performed in the presence of a chaperone        Extremities: Normal range of motion.  Edema: None  Mental Status: Normal mood and affect. Normal behavior. Normal judgment and thought content.   Patient Name: Joyce Stewart, female   DOB: 19-Nov-1990, 33 y.o.  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    Exam: Vulva and Vagina grossly normal.  Cervix viewed with speculum and colposcope after application of acetic acid:  Cervix Fully Visualized Squamocolumnar Junction Visibility: Fully visualized  Acetowhite lesions: 6 o'clock  Other Lesions: None Punctation: Not present  Mosaicism: Not present Abnormal vasculature: No   Biopsies: none ECC: No  Hemostasis achieved with:  none  Colposcopy Impression:  CIN1   Patient was given post procedure instructions.  Will call patient with results.  Assessment and Plan:  Pregnancy: G4P1021 at [redacted]w[redacted]d 1. [redacted] weeks gestation of pregnancy (Primary)  2. Supervision of other normal pregnancy, antepartum FHT normal  3. 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 No biopsies done. Will need ECC postpartum.  Discussed HPV postpartum  4. GBS bacteriuria Intrapartum PPx.  Preterm labor symptoms and general obstetric precautions including but not limited to vaginal bleeding, contractions, leaking of fluid and fetal movement were reviewed in detail with the patient. Please refer to After Visit Summary for other counseling recommendations.   No follow-ups on file.  Future Appointments  Date Time Provider Department Center  01/25/2024  1:00 PM [facility] PROVIDER 1 [facility]  01/25/2024  1:30 PM [facility] US5 [facility]  02/14/2024  2:10 PM Wynonna Fitzhenry J, DO [facility] None  03/06/2024 12:30 PM Price,  Keely C, PT [facility] None  03/13/2024  7:30 AM Price, Keely C, PT [facility] None  03/19/2024 10:15 AM Gretel Kendall C, PT [facility] None  03/20/2024  8:15 AM Abigail, Rollo DASEN, MD [facility] None  04/01/2024  7:30 AM Gretel Kendall C, PT [facility] None  04/09/2024 10:15 AM  Gretel Kendall C, PT [facility] None  04/16/2024 10:15 AM Gretel Kendall C, PT [facility] None  04/23/2024 10:15 AM Gretel Kendall BROCKS, PT [facility] None  04/30/2024 10:15 AM Gretel Kendall BROCKS, PT [facility] None    Jashae Wiggs J Aloysius Heinle, DO

## 2024-01-25 ENCOUNTER — Ambulatory Visit (HOSPITAL_BASED_OUTPATIENT_CLINIC_OR_DEPARTMENT_OTHER): Admitting: Maternal & Fetal Medicine

## 2024-01-25 ENCOUNTER — Ambulatory Visit: Attending: Maternal & Fetal Medicine

## 2024-01-25 VITALS — BP 100/51 | HR 74

## 2024-01-25 DIAGNOSIS — Z363 Encounter for antenatal screening for malformations: Secondary | ICD-10-CM | POA: Insufficient documentation

## 2024-01-25 DIAGNOSIS — Z3A10 10 weeks gestation of pregnancy: Secondary | ICD-10-CM

## 2024-01-25 DIAGNOSIS — Z3A20 20 weeks gestation of pregnancy: Secondary | ICD-10-CM | POA: Insufficient documentation

## 2024-01-25 DIAGNOSIS — Z3480 Encounter for supervision of other normal pregnancy, unspecified trimester: Secondary | ICD-10-CM | POA: Diagnosis present

## 2024-01-25 DIAGNOSIS — Z3482 Encounter for supervision of other normal pregnancy, second trimester: Secondary | ICD-10-CM | POA: Insufficient documentation

## 2024-01-25 DIAGNOSIS — Z3689 Encounter for other specified antenatal screening: Secondary | ICD-10-CM | POA: Insufficient documentation

## 2024-01-25 DIAGNOSIS — Z3A19 19 weeks gestation of pregnancy: Secondary | ICD-10-CM

## 2024-01-25 NOTE — Progress Notes (Signed)
 After review, MFM consult with provider is not indicated for today  Joyce Nathanel Pipe, MD 01/25/2024 3:37 PM  [facility]

## 2024-01-30 DIAGNOSIS — F4320 Adjustment disorder, unspecified: Secondary | ICD-10-CM | POA: Diagnosis not present

## 2024-02-07 ENCOUNTER — Ambulatory Visit: Admitting: Physical Therapy

## 2024-02-14 ENCOUNTER — Ambulatory Visit (INDEPENDENT_AMBULATORY_CARE_PROVIDER_SITE_OTHER): Admitting: Family Medicine

## 2024-02-14 VITALS — BP 100/52 | HR 77 | Wt 143.0 lb

## 2024-02-14 DIAGNOSIS — Z3480 Encounter for supervision of other normal pregnancy, unspecified trimester: Secondary | ICD-10-CM | POA: Diagnosis not present

## 2024-02-14 DIAGNOSIS — R87612 Low grade squamous intraepithelial lesion on cytologic smear of cervix (LGSIL): Secondary | ICD-10-CM | POA: Diagnosis not present

## 2024-02-14 DIAGNOSIS — Z3A23 23 weeks gestation of pregnancy: Secondary | ICD-10-CM

## 2024-02-14 DIAGNOSIS — R8271 Bacteriuria: Secondary | ICD-10-CM | POA: Diagnosis not present

## 2024-02-14 NOTE — Progress Notes (Signed)
 PRENATAL VISIT NOTE  Subjective:  Joyce Stewart is a 33 y.o. G4P1021 at [redacted]w[redacted]d being seen today for ongoing prenatal care.  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  Patient reports no complaints.  Contractions: Not present. Vag. Bleeding: None.  Movement: Present. Denies leaking of fluid.   The following portions of the patient's history were reviewed and updated as appropriate: allergies, current medications, past family history, past medical history, past social history, past surgical history and problem list.   Objective:   Vitals:   02/14/24 1434  BP: (!) 100/52  Pulse: 77  Weight: 143 lb (64.9 kg)    Fetal Status:  Fetal Heart Rate (bpm): 153   Movement: Present    General: Alert, oriented and cooperative. Patient is in no acute distress.  Skin: Skin is warm and dry. No rash noted.   Cardiovascular: Normal heart rate noted  Respiratory: Normal respiratory effort, no problems with respiration noted  Abdomen: Soft, gravid, appropriate for gestational age.  Pain/Pressure: Absent     Pelvic: Cervical exam deferred        Extremities: Normal range of motion.  Edema: None  Mental Status: Normal mood and affect. Normal behavior. Normal judgment and thought content.      11/22/2023    9:14 AM 12/20/2022   10:08 AM 12/11/2022    9:24 AM  Depression screen PHQ 2/9  Decreased Interest 0 0 0  Down, Depressed, Hopeless 1 0 0  PHQ - 2 Score 1 0 0  Altered sleeping 0 0 0  Tired, decreased energy 1 1 1   Change in appetite 0 0 0  Feeling bad or failure about yourself  0 0 0  Trouble concentrating 0 0 0  Moving slowly or fidgety/restless 0 0 0  Suicidal thoughts 0 0 0  PHQ-9 Score 2  1  1    Difficult doing work/chores   Not difficult at all     Data saved with a previous flowsheet row definition         11/22/2023    9:14 AM 12/20/2022   10:09 AM 12/11/2022    9:25 AM 02/28/2022    9:02 AM  GAD 7 : Generalized Anxiety Score  Nervous, Anxious, on Edge 0 1 1 0  Control/stop worrying 0 0 0 0  Worry too much - different things 0 0 0 0  Trouble relaxing 0 0 1 0  Restless 0 0 0 0  Easily annoyed or irritable 0 0 1 0  Afraid - awful might happen 0 1 1 0  Total GAD 7 Score 0 2 4 0  Anxiety Difficulty   Not difficult at all     Assessment and Plan:  Pregnancy: G4P1021 at [redacted]w[redacted]d 1. [redacted] weeks gestation of pregnancy (Primary)  2. Supervision of other normal pregnancy, antepartum FHT normal  3. 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 GBS bacteriuria Intrapartum ppx  Preterm labor symptoms and general obstetric precautions including but not limited to vaginal bleeding, contractions, leaking of fluid and fetal movement were reviewed in detail with the patient. Please refer to After Visit Summary for other counseling recommendations.   No follow-ups on file.  Future Appointments  Date Time Provider Department Center  03/06/2024 12:30 PM Gretel Celena BROCKS, PT [facility] None  03/13/2024  7:30 AM Gretel Celena  C, PT [facility] None  03/19/2024 10:15 AM Price, Keely C, PT [facility] None  03/20/2024  8:15 AM Dunn, Rollo DASEN, MD [facility] None  04/01/2024  7:30 AM Gretel Kendall C, PT [facility] None  04/02/2024  1:30 PM Eveline Lynwood MATSU, MD [facility] None  04/09/2024 10:15 AM Gretel Kendall C, PT [facility] None  04/16/2024 10:15 AM Gretel Kendall C, PT [facility] None  04/17/2024  2:10 PM Karanveer Ramakrishnan J, DO [facility] None  04/23/2024 10:15 AM Gretel Kendall C, PT [facility] None  04/29/2024  2:10 PM Synthia Raisin, CNM [facility] None  04/30/2024 10:15 AM Gretel Kendall BROCKS, PT [facility] None    Lang JINNY Peel, DO

## 2024-02-25 DIAGNOSIS — F4320 Adjustment disorder, unspecified: Secondary | ICD-10-CM | POA: Diagnosis not present

## 2024-03-06 ENCOUNTER — Ambulatory Visit: Attending: Obstetrics and Gynecology | Admitting: Physical Therapy

## 2024-03-06 DIAGNOSIS — R279 Unspecified lack of coordination: Secondary | ICD-10-CM | POA: Insufficient documentation

## 2024-03-06 DIAGNOSIS — M6281 Muscle weakness (generalized): Secondary | ICD-10-CM | POA: Diagnosis present

## 2024-03-06 DIAGNOSIS — R293 Abnormal posture: Secondary | ICD-10-CM | POA: Diagnosis present

## 2024-03-06 NOTE — Patient Instructions (Signed)
 MOVE IN THE TUBE  A movement pattern for individuals experiencing pelvic and pubic symphysis pain when transferring or with movement   Spreading our legs wide can stretch on the region of musculature that attaches to our pubic bone, causing pain in individuals who are experiencing pelvic weakness, pregnancy, etc. To move in the tube is to move in a way that decreases the amount of strain/pressure on the pubic bone.  When transferring from bed to standing, or from seated to standing, it can be helpful to practice the following: Keep your knees together when moving to avoid externally rotating the legs  Move the legs as a unit to avoid single limb stance as much as possible  When climbing stairs, take small steps to avoid spreading the legs too far. You can also side step up the stairs if this is more comfortable.  When walking, take smaller steps to avoid spreading the legs too far  When climbing into a vehicle, side-saddle yourself into the seat to avoid externally rotating the legs

## 2024-03-06 NOTE — Therapy (Signed)
 OUTPATIENT PHYSICAL THERAPY FEMALE PELVIC TREATMENT   Patient Name: Joyce Stewart MRN: [id number] DOB:March 13, 1991, 33 y.o., female Today's Date: 03/06/2024  END OF SESSION:  PT End of Session - 03/06/24 1257     Visit Number 2    Number of Visits 12    Date for Recertification  04/17/24    Authorization Type [employer]    Authorization - Visit Number 2    PT Start Time 1230    PT Stop Time 1315    PT Time Calculation (min) 45 min    Activity Tolerance Patient tolerated treatment well    Behavior During Therapy [facility] for tasks assessed/performed           Past Medical History:  Diagnosis Date   Chronic constipation 10/18/2017   Indication for care or intervention in labor or delivery 05/11/2022   Tendonitis of finger 05/15/2019   L thumb   Vaginal Pap smear, abnormal    Past Surgical History:  Procedure Laterality Date   NO PAST SURGERIES     Patient Active Problem List   Diagnosis Date Noted   Supervision of other normal pregnancy, antepartum 11/07/2023   GBS bacteriuria 04/24/2022   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 12/26/2021    PCP: Almarie Waddell NOVAK, NP  REFERRING PROVIDER: Abigail Rollo DASEN, MD   REFERRING DIAG: Z34.80 (ICD-10-CM) - Supervision of other normal pregnancy, antepartum  THERAPY DIAG:  Muscle weakness (generalized)  Unspecified lack of coordination  Abnormal posture  Rationale for Evaluation and Treatment: Rehabilitation  ONSET DATE: 2025  SUBJECTIVE:                                                                                                                                                                                           SUBJECTIVE STATEMENT: [redacted] weeks pregnant today - feeling a little discomfort in the back and hips due to business at home. She feels like her posture has been off. No significant pain to report.   Eval: Patient reports to PFPT after having success with pelvic PT during her first pregnancy. She is currently  pregnant with her second baby (18 weeks), no major discomfort currently, she is feeling relatively comfortable.  Fluid intake: moderate water intake, coconut water   FUNCTIONAL LIMITATIONS: no major functional limitations   PERTINENT HISTORY:  Medications for current condition: no Surgeries: no major surgical history  Other: no Sexual abuse: No  PAIN:  Are you having pain? No NPRS scale: 0/10  PRECAUTIONS: None  RED FLAGS: None   WEIGHT BEARING RESTRICTIONS: No  FALLS:  Has patient fallen in last 6 months? 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, seated for job   ACTIVITY LEVEL : walks 1.5 miles per day, tries to do strength training 1-2x/week (dumbbells, yoga ball, yoga mat, bands)   PLOF: Independent with basic ADLs  PATIENT GOALS: support for the rest of the pregnancy and a baseline for recovering postpartum   BOWEL MOVEMENT: Pain with bowel movement: No Type of bowel movement:Type (Bristol Stool Scale) 3-4, Frequency every other day - always been this way, Strain no, and Splinting no Fully empty rectum: Yes: depends on the day Leakage: No                                                     Caused by: no  Pads: No Fiber supplement/laxative No  URINATION: Pain with urination: No Fully empty bladder: Yes:                                   Post-void dribble: No Stream: Strong Urgency: Yes  Frequency:during the day within normal limits                                                           Nocturia: No   Leakage: every once in a while when urgency is high  Pads/briefs: No  INTERCOURSE:  Ability to have vaginal penetration Yes  Pain with intercourse: Initial Penetration Dryness: Yes  Climax: yes Marinoff Scale: 1/3 Lubricant: yes   PREGNANCY: Vaginal deliveries 1 Tearing Yes: minimal tearing with stitches  Episiotomy No C-section deliveries 0 Currently pregnant Yes: 18 weeks  currently   PROLAPSE: None   OBJECTIVE:  Note: Objective measures were completed at Evaluation unless otherwise noted.  PATIENT SURVEYS:  PFIQ-7: 15  COGNITION: Overall cognitive status: Within functional limits for tasks assessed     SENSATION: Light touch: Appears intact  LUMBAR SPECIAL TESTS:  Single leg stance test: Positive  FUNCTIONAL TESTS:  Single leg stance:  Rt: +  Lt: +  Sit-up test: 1/3  Squat: within normal limits  Bed mobility: within normal limits   GAIT: Assistive device utilized: None Comments: mild trendelenburg gait pattern with ambulation   POSTURE: rounded shoulders, forward head, and increased thoracic kyphosis  LUMBARAROM/PROM: within normal limits for all motions with no pain bilaterally   A/PROM A/PROM  Eval (% available)  Flexion   Extension   Right lateral flexion   Left lateral flexion   Right rotation   Left rotation    (Blank rows = not tested)  LOWER EXTREMITY ROM: within normal limits for all motions bilaterally with no pain   LOWER EXTREMITY MMT:  MMT Right eval Left eval  Hip flexion 4 4  Hip extension 3 3  Hip abduction 3 3  Hip adduction 4 4  Hip internal rotation 3 3  Hip external rotation 4 4  Knee flexion 4 4  Knee extension 4 4  Ankle dorsiflexion    Ankle plantarflexion    Ankle inversion    Ankle eversion     (Blank rows =  not tested) PALPATION:  General: no tenderness to palpation of bilateral hip flexors or adductors   Pelvic Alignment: within normal limits mild anterior pelvic tilt   Abdominal: abdominal bracing at rest  Diastasis: Yes: at and above umbilicus 2 finger widths  Distortion: No  Breathing: apical breathing pattern  Scar tissue: No                External Perineal Exam: moisture levels intact with sufficient clitoral hood mobility present                             Internal Pelvic Floor: Patient fully consents to today's internal vaginal examination. She wishes to get an  understanding of her baseline pelvic floor function so that she can have optimal outcomes during pregnancy and postpartum. She demonstrates a well timed, moderately strong pelvic floor contraction that is uncoordinated with her diaphragm. She utilizes full pelvic floor range of motion when she pairs a pelvic contraction with an exhale. No pain or palpable trigger points during today's exam.  Patient confirms identification and approves PT to assess internal pelvic floor and treatment Yes No emotional/communication barriers or cognitive limitation. Patient is motivated to learn. Patient understands and agrees with treatment goals and plan. PT explains patient will be examined in standing, sitting, and lying down to see how their muscles and joints work. When they are ready, they will be asked to remove their underwear so PT can examine their perineum. The patient is also given the option of providing their own chaperone as one is not provided in our facility. The patient also has the right and is explained the right to defer or refuse any part of the evaluation or treatment including the internal exam. With the patient's consent, PT will use one gloved finger to gently assess the muscles of the pelvic floor, seeing how well it contracts and relaxes and if there is muscle symmetry. After, the patient will get dressed and PT and patient will discuss exam findings and plan of care. PT and patient discuss plan of care, schedule, attendance policy and HEP activities.  PELVIC MMT:   MMT eval  Vaginal 3/5, 10 quick flicks, 6 second hold  Internal Anal Sphincter   External Anal Sphincter   Puborectalis   Diastasis Recti 2 fingers at and above umbilicus   (Blank rows = not tested)       TONE: Within normal limits bilaterally in superficial and deep pelvic floor musculature   PROLAPSE: None present in hooklying with cough test  TODAY'S TREATMENT:                                                                                                                               DATE:   EVAL 01/16/24: Examination completed, findings reviewed, pt educated on POC, HEP, and self care. Pt motivated to participate in PT and agreeable to attempt recommendations.  Seated pelvic floor contraction + diaphragmatic breathing 3x10  Education to continue current walking and strength training regimen until early December   03/06/24: Manual therapy  Sidelying sacral distraction 2x10  Sidelying coccygeal mobilizations AP x20  Sidelying mobilization of tailbone with movement into external rotation of the hip  Supine internal hip rotation + diaphragmatic breathing 2x10  Sidelying reverse clamshell with knee on foam roll + diaphragmatic breathing 2x12  Sidelying open books with knee on foam roll + diaphragmatic breathing 2x12  Quadruped rocking into internally rotated hips + diaphragmatic breathing 2x10  Quadruped rocking into externally rotated hips + diaphragmatic breathing 2x10  Bird dog + diaphragmatic breathing 2x10  Seated ball roll outs fwd/lateral + diaphragmatic breathing 2x10   PATIENT EDUCATION:  Education details: relative anatomy and the connection between the diaphragm and pelvic floor Person educated: Patient Education method: Explanation, Demonstration, Tactile cues, Verbal cues, and Handouts Education comprehension: verbalized understanding, returned demonstration, verbal cues required, tactile cues required, and needs further education  HOME EXERCISE PROGRAM: Access Code: [email or web address] URL: [email or web address] Date: 03/06/2024 Prepared by: Celena Domino  Exercises - Seated Pelvic Floor Contraction  - 1 x daily - 7 x weekly - 2 sets - 10 reps - Supine Bilateral Hip Internal Rotation Stretch  - 1 x daily - 7 x weekly - 2 sets - 10 reps - Sidelying Reverse Clamshell  - 1 x daily - 7 x weekly - 2 sets - 10 reps - Sidelying Open Book Thoracic Rotation with Knee on Foam Roll  - 1 x daily - 7  x weekly - 2 sets - 10 reps - Quadruped Rocking Slow  - 1 x daily - 7 x weekly - 2 sets - 3 reps - 45s hold - Bird Dog  - 1 x daily - 7 x weekly - 2 sets - 10 reps - Seated 3 Way Exercise Ball Roll Out Stretch  - 1 x daily - 7 x weekly - 2 sets - 10 reps  ASSESSMENT:  CLINICAL IMPRESSION: Patient is a 33 y.o. female  who was seen today for physical therapy treatment for antepartum pelvic floor maintenance care during second pregnancy. Pt has had minor right sided hip and low back/sacral discomfort. Manual therapy performed to lumbar and sacral spine and pt tolerated well. She reports restriction in the rib region, which was relieved with open books in sidelying. Core training introduction went very well and pt required minimal cueing for this. Pt tolerated session well and Pt would benefit from additional PT to further address deficits.    OBJECTIVE IMPAIRMENTS: decreased coordination, decreased endurance, decreased mobility, decreased ROM, and decreased strength.   ACTIVITY LIMITATIONS: carrying, lifting, bending, sitting, standing, squatting, stairs, transfers, bed mobility, continence, and toileting  PARTICIPATION LIMITATIONS: cleaning, laundry, interpersonal relationship, driving, shopping, community activity, and yard work  PERSONAL FACTORS: Age, Past/current experiences, and Time since onset of injury/illness/exacerbation are also affecting patient's functional outcome.   REHAB POTENTIAL: Good  CLINICAL DECISION MAKING: Stable/uncomplicated  EVALUATION COMPLEXITY: Low   GOALS: Goals reviewed with patient? Yes  SHORT TERM GOALS: Target date: 02/13/2024  Pt will be independent with HEP.  Baseline: Goal status: INITIAL  2.  Pt will be independent with use of squatty potty, relaxed toileting mechanics, and improved bowel movement techniques in order to increase ease of bowel movements and complete evacuation.  Baseline:  Goal status: INITIAL  3.  Pt will be independent with  the knack, urge suppression technique, and double voiding in order to improve bladder  habits and decrease urinary incontinence.  Baseline:  Goal status: INITIAL  LONG TERM GOALS: Target date: 07/16/2024  Pt will be independent with advanced HEP.  Baseline:  Goal status: INITIAL  2.  Pt to demonstrate improved coordination of pelvic floor and breathing mechanics with 10# squat with appropriate synergistic patterns to decrease pain and leakage at least 75% of the time for improved ability to complete a 30 minute workout with strain at pelvic floor and symptoms.   Baseline:  Goal status: INITIAL  3.  Pt will be able to correctly perform diaphragmatic breathing and appropriate pressure management in order to prevent worsening vaginal wall laxity and improve pelvic floor A/ROM.  Baseline:  Goal status: INITIAL  PLAN:  PT FREQUENCY: 1-2x/week  PT DURATION: 6 months  PLANNED INTERVENTIONS: 97110-Therapeutic exercises, 97530- Therapeutic activity, 97112- Neuromuscular re-education, 97535- Self Care, [id number]- Manual therapy, Patient/Family education, Taping, Joint mobilization, Spinal mobilization, Scar mobilization, Cryotherapy, Moist heat, and Biofeedback  PLAN FOR NEXT SESSION: continued birth and labor prep, toileting mechanics for optimal emptying, core strengthening exercises   Celena JAYSON Domino, PT 03/06/2024, 12:58 PM [facility] [address], [address] [place], [place] [address] Phone # [phone] Fax [phone]

## 2024-03-13 ENCOUNTER — Ambulatory Visit: Admitting: Physical Therapy

## 2024-03-13 DIAGNOSIS — R279 Unspecified lack of coordination: Secondary | ICD-10-CM

## 2024-03-13 DIAGNOSIS — M6281 Muscle weakness (generalized): Secondary | ICD-10-CM | POA: Diagnosis not present

## 2024-03-13 DIAGNOSIS — R293 Abnormal posture: Secondary | ICD-10-CM

## 2024-03-13 NOTE — Therapy (Signed)
 OUTPATIENT PHYSICAL THERAPY FEMALE PELVIC TREATMENT   Patient Name: Joyce Stewart MRN: [id number] DOB:September 18, 1990, 33 y.o., female Today's Date: 03/13/2024  END OF SESSION:  PT End of Session - 03/13/24 0743     Visit Number 3    Number of Visits 12    Date for Recertification  04/17/24    Authorization Type [employer]    PT Start Time [phone]    PT Stop Time 0800    PT Time Calculation (min) 26 min    Activity Tolerance Patient tolerated treatment well    Behavior During Therapy [facility] for tasks assessed/performed            Past Medical History:  Diagnosis Date   Chronic constipation 10/18/2017   Indication for care or intervention in labor or delivery 05/11/2022   Tendonitis of finger 05/15/2019   L thumb   Vaginal Pap smear, abnormal    Past Surgical History:  Procedure Laterality Date   NO PAST SURGERIES     Patient Active Problem List   Diagnosis Date Noted   Supervision of other normal pregnancy, antepartum 11/07/2023   GBS bacteriuria 04/24/2022   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 12/26/2021    PCP: Almarie Waddell NOVAK, NP  REFERRING PROVIDER: Abigail Rollo DASEN, MD   REFERRING DIAG: Z34.80 (ICD-10-CM) - Supervision of other normal pregnancy, antepartum  THERAPY DIAG:  Muscle weakness (generalized)  Unspecified lack of coordination  Abnormal posture  Rationale for Evaluation and Treatment: Rehabilitation  ONSET DATE: 2025  SUBJECTIVE:                                                                                                                                                                                           SUBJECTIVE STATEMENT: [redacted] weeks pregnant - yesterday was the first day she noticed a decrease in her pubic bone pain. She has had some tightness on the outer right hip and this bothers her when sitting for a long time.   Eval: Patient reports to PFPT after having success with pelvic PT during her first pregnancy. She is currently pregnant  with her second baby (18 weeks), no major discomfort currently, she is feeling relatively comfortable.  Fluid intake: moderate water intake, coconut water   FUNCTIONAL LIMITATIONS: no major functional limitations   PERTINENT HISTORY:  Medications for current condition: no Surgeries: no major surgical history  Other: no Sexual abuse: No  PAIN:  Are you having pain? No NPRS scale: 0/10  PRECAUTIONS: None  RED FLAGS: None   WEIGHT BEARING RESTRICTIONS: No  FALLS:  Has patient fallen in last 6 months? 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, seated for job   ACTIVITY LEVEL : walks 1.5 miles per day, tries to do strength training 1-2x/week (dumbbells, yoga ball, yoga mat, bands)   PLOF: Independent with basic ADLs  PATIENT GOALS: support for the rest of the pregnancy and a baseline for recovering postpartum   BOWEL MOVEMENT: Pain with bowel movement: No Type of bowel movement:Type (Bristol Stool Scale) 3-4, Frequency every other day - always been this way, Strain no, and Splinting no Fully empty rectum: Yes: depends on the day Leakage: No                                                     Caused by: no  Pads: No Fiber supplement/laxative No  URINATION: Pain with urination: No Fully empty bladder: Yes:                                   Post-void dribble: No Stream: Strong Urgency: Yes  Frequency:during the day within normal limits                                                           Nocturia: No   Leakage: every once in a while when urgency is high  Pads/briefs: No  INTERCOURSE:  Ability to have vaginal penetration Yes  Pain with intercourse: Initial Penetration Dryness: Yes  Climax: yes Marinoff Scale: 1/3 Lubricant: yes   PREGNANCY: Vaginal deliveries 1 Tearing Yes: minimal tearing with stitches  Episiotomy No C-section deliveries 0 Currently pregnant Yes: 18 weeks currently    PROLAPSE: None   OBJECTIVE:  Note: Objective measures were completed at Evaluation unless otherwise noted.  PATIENT SURVEYS:  PFIQ-7: 15  COGNITION: Overall cognitive status: Within functional limits for tasks assessed     SENSATION: Light touch: Appears intact  LUMBAR SPECIAL TESTS:  Single leg stance test: Positive  FUNCTIONAL TESTS:  Single leg stance:  Rt: +  Lt: +  Sit-up test: 1/3  Squat: within normal limits  Bed mobility: within normal limits   GAIT: Assistive device utilized: None Comments: mild trendelenburg gait pattern with ambulation   POSTURE: rounded shoulders, forward head, and increased thoracic kyphosis  LUMBARAROM/PROM: within normal limits for all motions with no pain bilaterally   A/PROM A/PROM  Eval (% available)  Flexion   Extension   Right lateral flexion   Left lateral flexion   Right rotation   Left rotation    (Blank rows = not tested)  LOWER EXTREMITY ROM: within normal limits for all motions bilaterally with no pain   LOWER EXTREMITY MMT:  MMT Right eval Left eval  Hip flexion 4 4  Hip extension 3 3  Hip abduction 3 3  Hip adduction 4 4  Hip internal rotation 3 3  Hip external rotation 4 4  Knee flexion 4 4  Knee extension 4 4  Ankle dorsiflexion    Ankle plantarflexion    Ankle inversion    Ankle eversion     (Blank rows = not  tested) PALPATION:  General: no tenderness to palpation of bilateral hip flexors or adductors   Pelvic Alignment: within normal limits mild anterior pelvic tilt   Abdominal: abdominal bracing at rest  Diastasis: Yes: at and above umbilicus 2 finger widths  Distortion: No  Breathing: apical breathing pattern  Scar tissue: No                External Perineal Exam: moisture levels intact with sufficient clitoral hood mobility present                             Internal Pelvic Floor: Patient fully consents to today's internal vaginal examination. She wishes to get an understanding of  her baseline pelvic floor function so that she can have optimal outcomes during pregnancy and postpartum. She demonstrates a well timed, moderately strong pelvic floor contraction that is uncoordinated with her diaphragm. She utilizes full pelvic floor range of motion when she pairs a pelvic contraction with an exhale. No pain or palpable trigger points during today's exam.  Patient confirms identification and approves PT to assess internal pelvic floor and treatment Yes No emotional/communication barriers or cognitive limitation. Patient is motivated to learn. Patient understands and agrees with treatment goals and plan. PT explains patient will be examined in standing, sitting, and lying down to see how their muscles and joints work. When they are ready, they will be asked to remove their underwear so PT can examine their perineum. The patient is also given the option of providing their own chaperone as one is not provided in our facility. The patient also has the right and is explained the right to defer or refuse any part of the evaluation or treatment including the internal exam. With the patient's consent, PT will use one gloved finger to gently assess the muscles of the pelvic floor, seeing how well it contracts and relaxes and if there is muscle symmetry. After, the patient will get dressed and PT and patient will discuss exam findings and plan of care. PT and patient discuss plan of care, schedule, attendance policy and HEP activities.  PELVIC MMT:   MMT eval  Vaginal 3/5, 10 quick flicks, 6 second hold  Internal Anal Sphincter   External Anal Sphincter   Puborectalis   Diastasis Recti 2 fingers at and above umbilicus   (Blank rows = not tested)       TONE: Within normal limits bilaterally in superficial and deep pelvic floor musculature   PROLAPSE: None present in hooklying with cough test  TODAY'S TREATMENT:                                                                                                                               DATE:   EVAL 01/16/24: Examination completed, findings reviewed, pt educated on POC, HEP, and self care. Pt motivated to participate in PT and agreeable to attempt recommendations.  Seated pelvic floor contraction + diaphragmatic breathing 3x10  Education to continue current walking and strength training regimen until early December   03/06/24: Manual therapy  Sidelying sacral distraction 2x10  Sidelying coccygeal mobilizations AP x20  Sidelying mobilization of tailbone with movement into external rotation of the hip  Supine internal hip rotation + diaphragmatic breathing 2x10  Sidelying reverse clamshell with knee on foam roll + diaphragmatic breathing 2x12  Sidelying open books with knee on foam roll + diaphragmatic breathing 2x12  Quadruped rocking into internally rotated hips + diaphragmatic breathing 2x10  Quadruped rocking into externally rotated hips + diaphragmatic breathing 2x10  Bird dog + diaphragmatic breathing 2x10  Seated ball roll outs fwd/lateral + diaphragmatic breathing 2x10   03/13/24: Nustep level 1 - 3 minutes - PT present to discuss current status  Cat/cow + diaphragmatic breathing 2x62min  Bird dog + diaphragmatic breathing 2x10 each side  Seated reclined internal and external hip rotation + diaphragmatic breathing 2x37min  Supine figure 4 stretch + diaphragmatic breathing 2x79min  Seated abdominal ball press + transverse abdominis contraction + diaphragmatic breathing 2x107min   PATIENT EDUCATION:  Education details: relative anatomy and the connection between the diaphragm and pelvic floor Person educated: Patient Education method: Explanation, Demonstration, Tactile cues, Verbal cues, and Handouts Education comprehension: verbalized understanding, returned demonstration, verbal cues required, tactile cues required, and needs further education  HOME EXERCISE PROGRAM: Access Code: [email or web address] URL:  [email or web address] Date: 03/13/2024 Prepared by: Celena Domino  Exercises - Seated Pelvic Floor Contraction  - 1 x daily - 7 x weekly - 2 sets - 10 reps - Supine Bilateral Hip Internal Rotation Stretch  - 1 x daily - 7 x weekly - 2 sets - 10 reps - Sidelying Reverse Clamshell  - 1 x daily - 7 x weekly - 2 sets - 10 reps - Sidelying Open Book Thoracic Rotation with Knee on Foam Roll  - 1 x daily - 7 x weekly - 2 sets - 10 reps - Quadruped Rocking Slow  - 1 x daily - 7 x weekly - 2 sets - 3 reps - 45s hold - Bird Dog  - 1 x daily - 7 x weekly - 2 sets - 10 reps - Seated 3 Way Exercise Ball Roll Out Stretch  - 1 x daily - 7 x weekly - 2 sets - 10 reps - Cat Cow  - 1 x daily - 7 x weekly - 2 sets - 10 reps - Supine Figure 4 Piriformis Stretch  - 1 x daily - 7 x weekly - 2 sets - 1 min hold - Seated Combined Hip Internal and External Rotation AROM: Hip Switch  - 1 x daily - 7 x weekly - 2 sets - 10 reps - Seated Abdominal Press into [employer]  - 1 x daily - 7 x weekly - 2 sets - 10 reps  ASSESSMENT:  CLINICAL IMPRESSION: Patient is a 33 y.o. female  who was seen today for physical therapy treatment for antepartum pelvic floor maintenance care during second pregnancy. Pt has had minor right sided hip and low back/sacral discomfort so piriformis stretching was introduced today, pt tolerated well. Pt found core progressions to be challenging yet tolerable and required minimal cueing from PT. Pt tolerated session well and Pt would benefit from additional PT to further address deficits.    OBJECTIVE IMPAIRMENTS: decreased coordination, decreased endurance, decreased mobility, decreased ROM, and decreased strength.   ACTIVITY LIMITATIONS: carrying, lifting, bending, sitting, standing, squatting, stairs, transfers,  bed mobility, continence, and toileting  PARTICIPATION LIMITATIONS: cleaning, laundry, interpersonal relationship, driving, shopping, community activity, and yard  work  PERSONAL FACTORS: Age, Past/current experiences, and Time since onset of injury/illness/exacerbation are also affecting patient's functional outcome.   REHAB POTENTIAL: Good  CLINICAL DECISION MAKING: Stable/uncomplicated  EVALUATION COMPLEXITY: Low   GOALS: Goals reviewed with patient? Yes  SHORT TERM GOALS: Target date: 02/13/2024  Pt will be independent with HEP.  Baseline: Goal status: INITIAL  2.  Pt will be independent with use of squatty potty, relaxed toileting mechanics, and improved bowel movement techniques in order to increase ease of bowel movements and complete evacuation.  Baseline:  Goal status: INITIAL  3.  Pt will be independent with the knack, urge suppression technique, and double voiding in order to improve bladder habits and decrease urinary incontinence.  Baseline:  Goal status: INITIAL  LONG TERM GOALS: Target date: 07/16/2024  Pt will be independent with advanced HEP.  Baseline:  Goal status: INITIAL  2.  Pt to demonstrate improved coordination of pelvic floor and breathing mechanics with 10# squat with appropriate synergistic patterns to decrease pain and leakage at least 75% of the time for improved ability to complete a 30 minute workout with strain at pelvic floor and symptoms.   Baseline:  Goal status: INITIAL  3.  Pt will be able to correctly perform diaphragmatic breathing and appropriate pressure management in order to prevent worsening vaginal wall laxity and improve pelvic floor A/ROM.  Baseline:  Goal status: INITIAL  PLAN:  PT FREQUENCY: 1-2x/week  PT DURATION: 6 months  PLANNED INTERVENTIONS: 97110-Therapeutic exercises, 97530- Therapeutic activity, 97112- Neuromuscular re-education, 97535- Self Care, [id number]- Manual therapy, Patient/Family education, Taping, Joint mobilization, Spinal mobilization, Scar mobilization, Cryotherapy, Moist heat, and Biofeedback  PLAN FOR NEXT SESSION: continued birth and labor prep, toileting  mechanics for optimal emptying, core strengthening exercises   Celena JAYSON Domino, PT 03/13/2024, 7:59 AM [facility] [address] [place], [place] [address] Phone # [phone] Fax [phone]

## 2024-03-19 ENCOUNTER — Ambulatory Visit: Admitting: Physical Therapy

## 2024-03-19 DIAGNOSIS — M6281 Muscle weakness (generalized): Secondary | ICD-10-CM

## 2024-03-19 DIAGNOSIS — R279 Unspecified lack of coordination: Secondary | ICD-10-CM

## 2024-03-19 DIAGNOSIS — R293 Abnormal posture: Secondary | ICD-10-CM

## 2024-03-19 NOTE — Therapy (Signed)
 OUTPATIENT PHYSICAL THERAPY FEMALE PELVIC TREATMENT   Patient Name: Joyce Stewart MRN: [id number] DOB:September 12, 1990, 33 y.o., female Today's Date: 03/19/2024  END OF SESSION:  PT End of Session - 03/19/24 1056     Visit Number 4    Number of Visits 12    Date for Recertification  04/17/24    Authorization Type [employer]    PT Start Time 1015    PT Stop Time 1100    PT Time Calculation (min) 45 min    Activity Tolerance Patient tolerated treatment well    Behavior During Therapy [facility] for tasks assessed/performed             Past Medical History:  Diagnosis Date   Chronic constipation 10/18/2017   Indication for care or intervention in labor or delivery 05/11/2022   Tendonitis of finger 05/15/2019   L thumb   Vaginal Pap smear, abnormal    Past Surgical History:  Procedure Laterality Date   NO PAST SURGERIES     Patient Active Problem List   Diagnosis Date Noted   Supervision of other normal pregnancy, antepartum 11/07/2023   GBS bacteriuria 04/24/2022   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 12/26/2021    PCP: Almarie Waddell NOVAK, NP  REFERRING PROVIDER: Abigail Rollo DASEN, MD   REFERRING DIAG: Z34.80 (ICD-10-CM) - Supervision of other normal pregnancy, antepartum  THERAPY DIAG:  Muscle weakness (generalized)  Unspecified lack of coordination  Abnormal posture  Rationale for Evaluation and Treatment: Rehabilitation  ONSET DATE: 2025  SUBJECTIVE:                                                                                                                                                                                           SUBJECTIVE STATEMENT: [redacted] weeks pregnant today - this week she had a lot of discomfort and some leakage. She has leaked with some urgency and she has lots of pressure when she has to poop. Using the bathroom is very painful but she doesn't think she has hemorrhoids. No pubic bone pain this week.   Eval: Patient reports to PFPT after  having success with pelvic PT during her first pregnancy. She is currently pregnant with her second baby (18 weeks), no major discomfort currently, she is feeling relatively comfortable.  Fluid intake: moderate water intake, coconut water   FUNCTIONAL LIMITATIONS: no major functional limitations   PERTINENT HISTORY:  Medications for current condition: no Surgeries: no major surgical history  Other: no Sexual abuse: No  PAIN:  Are you having pain? No NPRS scale: 0/10  PRECAUTIONS: None  RED FLAGS: None   WEIGHT BEARING RESTRICTIONS:  No  FALLS:  Has patient fallen in last 6 months? 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, seated for job   ACTIVITY LEVEL : walks 1.5 miles per day, tries to do strength training 1-2x/week (dumbbells, yoga ball, yoga mat, bands)   PLOF: Independent with basic ADLs  PATIENT GOALS: support for the rest of the pregnancy and a baseline for recovering postpartum   BOWEL MOVEMENT: Pain with bowel movement: No Type of bowel movement:Type (Bristol Stool Scale) 3-4, Frequency every other day - always been this way, Strain no, and Splinting no Fully empty rectum: Yes: depends on the day Leakage: No                                                     Caused by: no  Pads: No Fiber supplement/laxative No  URINATION: Pain with urination: No Fully empty bladder: Yes:                                   Post-void dribble: No Stream: Strong Urgency: Yes  Frequency:during the day within normal limits                                                           Nocturia: No   Leakage: every once in a while when urgency is high  Pads/briefs: No  INTERCOURSE:  Ability to have vaginal penetration Yes  Pain with intercourse: Initial Penetration Dryness: Yes  Climax: yes Marinoff Scale: 1/3 Lubricant: yes   PREGNANCY: Vaginal deliveries 1 Tearing Yes: minimal tearing with stitches   Episiotomy No C-section deliveries 0 Currently pregnant Yes: 18 weeks currently   PROLAPSE: None   OBJECTIVE:  Note: Objective measures were completed at Evaluation unless otherwise noted.  PATIENT SURVEYS:  PFIQ-7: 15  COGNITION: Overall cognitive status: Within functional limits for tasks assessed     SENSATION: Light touch: Appears intact  LUMBAR SPECIAL TESTS:  Single leg stance test: Positive  FUNCTIONAL TESTS:  Single leg stance:  Rt: +  Lt: +  Sit-up test: 1/3  Squat: within normal limits  Bed mobility: within normal limits   GAIT: Assistive device utilized: None Comments: mild trendelenburg gait pattern with ambulation   POSTURE: rounded shoulders, forward head, and increased thoracic kyphosis  LUMBARAROM/PROM: within normal limits for all motions with no pain bilaterally   A/PROM A/PROM  Eval (% available)  Flexion   Extension   Right lateral flexion   Left lateral flexion   Right rotation   Left rotation    (Blank rows = not tested)  LOWER EXTREMITY ROM: within normal limits for all motions bilaterally with no pain   LOWER EXTREMITY MMT:  MMT Right eval Left eval  Hip flexion 4 4  Hip extension 3 3  Hip abduction 3 3  Hip adduction 4 4  Hip internal rotation 3 3  Hip external rotation 4 4  Knee flexion 4 4  Knee extension 4 4  Ankle dorsiflexion    Ankle plantarflexion  Ankle inversion    Ankle eversion     (Blank rows = not tested) PALPATION:  General: no tenderness to palpation of bilateral hip flexors or adductors   Pelvic Alignment: within normal limits mild anterior pelvic tilt   Abdominal: abdominal bracing at rest  Diastasis: Yes: at and above umbilicus 2 finger widths  Distortion: No  Breathing: apical breathing pattern  Scar tissue: No                External Perineal Exam: moisture levels intact with sufficient clitoral hood mobility present                             Internal Pelvic Floor: Patient fully  consents to today's internal vaginal examination. She wishes to get an understanding of her baseline pelvic floor function so that she can have optimal outcomes during pregnancy and postpartum. She demonstrates a well timed, moderately strong pelvic floor contraction that is uncoordinated with her diaphragm. She utilizes full pelvic floor range of motion when she pairs a pelvic contraction with an exhale. No pain or palpable trigger points during today's exam.  Patient confirms identification and approves PT to assess internal pelvic floor and treatment Yes No emotional/communication barriers or cognitive limitation. Patient is motivated to learn. Patient understands and agrees with treatment goals and plan. PT explains patient will be examined in standing, sitting, and lying down to see how their muscles and joints work. When they are ready, they will be asked to remove their underwear so PT can examine their perineum. The patient is also given the option of providing their own chaperone as one is not provided in our facility. The patient also has the right and is explained the right to defer or refuse any part of the evaluation or treatment including the internal exam. With the patient's consent, PT will use one gloved finger to gently assess the muscles of the pelvic floor, seeing how well it contracts and relaxes and if there is muscle symmetry. After, the patient will get dressed and PT and patient will discuss exam findings and plan of care. PT and patient discuss plan of care, schedule, attendance policy and HEP activities.  PELVIC MMT:   MMT eval  Vaginal 3/5, 10 quick flicks, 6 second hold  Internal Anal Sphincter   External Anal Sphincter   Puborectalis   Diastasis Recti 2 fingers at and above umbilicus   (Blank rows = not tested)       TONE: Within normal limits bilaterally in superficial and deep pelvic floor musculature   PROLAPSE: None present in hooklying with cough test  TODAY'S  TREATMENT:                                                                                                                              DATE:   03/06/24: Manual therapy  Sidelying sacral distraction 2x10  Sidelying coccygeal mobilizations  AP x20  Sidelying mobilization of tailbone with movement into external rotation of the hip  Supine internal hip rotation + diaphragmatic breathing 2x10  Sidelying reverse clamshell with knee on foam roll + diaphragmatic breathing 2x12  Sidelying open books with knee on foam roll + diaphragmatic breathing 2x12  Quadruped rocking into internally rotated hips + diaphragmatic breathing 2x10  Quadruped rocking into externally rotated hips + diaphragmatic breathing 2x10  Bird dog + diaphragmatic breathing 2x10  Seated ball roll outs fwd/lateral + diaphragmatic breathing 2x10   03/13/24: Nustep level 1 - 3 minutes - PT present to discuss current status  Cat/cow + diaphragmatic breathing 2x67min  Bird dog + diaphragmatic breathing 2x10 each side  Seated reclined internal and external hip rotation + diaphragmatic breathing 2x5min  Supine figure 4 stretch + diaphragmatic breathing 2x75min  Seated abdominal ball press + transverse abdominis contraction + diaphragmatic breathing 2x34min   03/19/24: Nustep level 1 - 5 minutes - PT present to discuss current status  Seated abdominal ball press + transverse abdominis contraction + diaphragmatic breathing 2x16min  Bird dog + diaphragmatic breathing x10  Primal push up + diaphragmatic breathing 2x10  Seated adductor ball squeeze + transverse abdominis contraction + diaphragmatic breathing 2x59min Seated ball roll outs fwd/laterally each side x5  Seated ball hip circles + diaphragmatic breathing x10 each  Urge drill education for managing urge urinary incontinence when bladder is full  Toileting positioning for ease of evacuation of stool   PATIENT EDUCATION:  Education details: relative anatomy and the connection  between the diaphragm and pelvic floor Person educated: Patient Education method: Explanation, Demonstration, Tactile cues, Verbal cues, and Handouts Education comprehension: verbalized understanding, returned demonstration, verbal cues required, tactile cues required, and needs further education  HOME EXERCISE PROGRAM: Access Code: [email or web address] URL: [email or web address] Date: 03/19/2024 Prepared by: Celena Domino  Exercises - Seated Pelvic Floor Contraction  - 1 x daily - 7 x weekly - 2 sets - 10 reps - Supine Bilateral Hip Internal Rotation Stretch  - 1 x daily - 7 x weekly - 2 sets - 10 reps - Sidelying Reverse Clamshell  - 1 x daily - 7 x weekly - 2 sets - 10 reps - Sidelying Open Book Thoracic Rotation with Knee on Foam Roll  - 1 x daily - 7 x weekly - 2 sets - 10 reps - Quadruped Rocking Slow  - 1 x daily - 7 x weekly - 2 sets - 3 reps - 45s hold - Bird Dog  - 1 x daily - 7 x weekly - 2 sets - 10 reps - Seated 3 Way Exercise Ball Roll Out Stretch  - 1 x daily - 7 x weekly - 2 sets - 10 reps - Cat Cow  - 1 x daily - 7 x weekly - 2 sets - 10 reps - Supine Figure 4 Piriformis Stretch  - 1 x daily - 7 x weekly - 2 sets - 1 min hold - Seated Combined Hip Internal and External Rotation AROM: Hip Switch  - 1 x daily - 7 x weekly - 2 sets - 10 reps - Seated Abdominal Press into [employer]  - 1 x daily - 7 x weekly - 2 sets - 10 reps - Seated Hip Adduction Squeeze with Ball  - 1 x daily - 7 x weekly - 2 sets - 10 reps - Primal Push Up  - 1 x daily - 7 x weekly - 2 sets - 5 reps - Seated 3 Way  Exercise Emcor Stretch  - 1 x daily - 7 x weekly - 2 sets - 5 reps - Pelvic Circles on Swiss Ball  - 1 x daily - 7 x weekly - 2 sets - 10 reps  ASSESSMENT:  CLINICAL IMPRESSION: Patient is a 33 y.o. female  who was seen today for physical therapy treatment for antepartum pelvic floor maintenance care during second pregnancy. She's had more discomfort in the past week with bowel  movements, so we reviewed how to position herself when she is on the toilet to ease evacuation of stool. Pt found core progressions to be challenging yet tolerable and required minimal cueing from PT. No pubic bone pain by the end of today's session. Pt tolerated session well and Pt would benefit from additional PT to further address deficits.    OBJECTIVE IMPAIRMENTS: decreased coordination, decreased endurance, decreased mobility, decreased ROM, and decreased strength.   ACTIVITY LIMITATIONS: carrying, lifting, bending, sitting, standing, squatting, stairs, transfers, bed mobility, continence, and toileting  PARTICIPATION LIMITATIONS: cleaning, laundry, interpersonal relationship, driving, shopping, community activity, and yard work  PERSONAL FACTORS: Age, Past/current experiences, and Time since onset of injury/illness/exacerbation are also affecting patient's functional outcome.   REHAB POTENTIAL: Good  CLINICAL DECISION MAKING: Stable/uncomplicated  EVALUATION COMPLEXITY: Low   GOALS: Goals reviewed with patient? Yes  SHORT TERM GOALS: Target date: 02/13/2024  Pt will be independent with HEP.  Baseline: Goal status: INITIAL  2.  Pt will be independent with use of squatty potty, relaxed toileting mechanics, and improved bowel movement techniques in order to increase ease of bowel movements and complete evacuation.  Baseline:  Goal status: INITIAL  3.  Pt will be independent with the knack, urge suppression technique, and double voiding in order to improve bladder habits and decrease urinary incontinence.  Baseline:  Goal status: INITIAL  LONG TERM GOALS: Target date: 07/16/2024  Pt will be independent with advanced HEP.  Baseline:  Goal status: INITIAL  2.  Pt to demonstrate improved coordination of pelvic floor and breathing mechanics with 10# squat with appropriate synergistic patterns to decrease pain and leakage at least 75% of the time for improved ability to complete  a 30 minute workout with strain at pelvic floor and symptoms.   Baseline:  Goal status: INITIAL  3.  Pt will be able to correctly perform diaphragmatic breathing and appropriate pressure management in order to prevent worsening vaginal wall laxity and improve pelvic floor A/ROM.  Baseline:  Goal status: INITIAL  PLAN:  PT FREQUENCY: 1-2x/week  PT DURATION: 6 months  PLANNED INTERVENTIONS: 97110-Therapeutic exercises, 97530- Therapeutic activity, 97112- Neuromuscular re-education, 97535- Self Care, [id number]- Manual therapy, Patient/Family education, Taping, Joint mobilization, Spinal mobilization, Scar mobilization, Cryotherapy, Moist heat, and Biofeedback  PLAN FOR NEXT SESSION: continued birth and labor prep, toileting mechanics for optimal emptying, core strengthening exercises   Celena JAYSON Domino, PT 03/19/2024, 10:58 AM [facility] [address] [place], [place] [address] Phone # [phone] Fax [phone]

## 2024-03-19 NOTE — Patient Instructions (Signed)

## 2024-03-20 ENCOUNTER — Ambulatory Visit: Admitting: Obstetrics and Gynecology

## 2024-03-20 VITALS — BP 100/64 | HR 85 | Wt 147.0 lb

## 2024-03-20 DIAGNOSIS — Z3A28 28 weeks gestation of pregnancy: Secondary | ICD-10-CM | POA: Diagnosis not present

## 2024-03-20 DIAGNOSIS — Z3483 Encounter for supervision of other normal pregnancy, third trimester: Secondary | ICD-10-CM

## 2024-03-20 DIAGNOSIS — R8271 Bacteriuria: Secondary | ICD-10-CM

## 2024-03-20 DIAGNOSIS — Z23 Encounter for immunization: Secondary | ICD-10-CM | POA: Diagnosis not present

## 2024-03-20 DIAGNOSIS — Z3480 Encounter for supervision of other normal pregnancy, unspecified trimester: Secondary | ICD-10-CM

## 2024-03-20 NOTE — Progress Notes (Signed)
 PRENATAL VISIT NOTE  Subjective:  Joyce Stewart is a 33 y.o. G4P1021 at [redacted]w[redacted]d being seen today for ongoing prenatal care.  She is currently monitored for the following issues for this low-risk pregnancy and has LGSIL on Pap smear of cervix; GBS bacteriuria; and Supervision of other normal pregnancy, antepartum on their problem list.  Patient reports no complaints.  Contractions: Not present. Vag. Bleeding: None.  Movement: Present. Denies leaking of fluid.   The following portions of the patient's history were reviewed and updated as appropriate: allergies, current medications, past family history, past medical history, past social history, past surgical history and problem list.   Objective:   Vitals:   03/20/24 0837  BP: 100/64  Pulse: 85  Weight: 147 lb (66.7 kg)    Fetal Status:  Fetal Heart Rate (bpm): 149 Fundal Height: 28 cm Movement: Present    General: Alert, oriented and cooperative. Patient is in no acute distress.  Skin: Skin is warm and dry. No rash noted.   Cardiovascular: Normal heart rate noted  Respiratory: Normal respiratory effort, no problems with respiration noted  Abdomen: Soft, gravid, appropriate for gestational age.  Pain/Pressure: Absent     Pelvic: Cervical exam deferred        Extremities: Normal range of motion.  Edema: Trace (developed vericose veins)  Mental Status: Normal mood and affect. Normal behavior. Normal judgment and thought content.      03/20/2024    8:39 AM 11/22/2023    9:14 AM 12/20/2022   10:08 AM  Depression screen PHQ 2/9  Decreased Interest 0 0 0  Down, Depressed, Hopeless 0 1 0  PHQ - 2 Score 0 1 0  Altered sleeping 0 0 0  Tired, decreased energy 1 1 1   Change in appetite 0 0 0  Feeling bad or failure about yourself  0 0 0  Trouble concentrating 0 0 0  Moving slowly or fidgety/restless 0 0 0  Suicidal thoughts 0 0 0  PHQ-9 Score 1 2  1       Data saved with a previous flowsheet row definition        03/20/2024     8:39 AM 11/22/2023    9:14 AM 12/20/2022   10:09 AM 12/11/2022    9:25 AM  GAD 7 : Generalized Anxiety Score  Nervous, Anxious, on Edge 0 0 1 1  Control/stop worrying 0 0 0 0  Worry too much - different things 0 0 0 0  Trouble relaxing 0 0 0 1  Restless 0 0 0 0  Easily annoyed or irritable 0 0 0 1  Afraid - awful might happen 0 0 1 1  Total GAD 7 Score 0 0 2 4  Anxiety Difficulty    Not difficult at all    Assessment and Plan:  Pregnancy: G4P1021 at [redacted]w[redacted]d 1. Supervision of other normal pregnancy, antepartum (Primary) Anticipatory guidance Tdap today Counseled on flu- will consider Pt plans Fresh Test- reviewed limitations and limited studies, pt accepts these risks - CBC - RPR W/RFLX TO RPR TITER, TREPONEMAL AB, SCREEN AND DIAGNOSIS - HIV Antibody (routine testing w rflx) - Glucose tolerance, 1 hour  2. GBS bacteriuria - CBC - RPR W/RFLX TO RPR TITER, TREPONEMAL AB, SCREEN AND DIAGNOSIS - HIV Antibody (routine testing w rflx)  3. [redacted] weeks gestation of pregnancy - CBC - RPR W/RFLX TO RPR TITER, TREPONEMAL AB, SCREEN AND DIAGNOSIS - HIV Antibody (routine testing w rflx)  4. Need for Tdap vaccination - Tdap  vaccine greater than or equal to 7yo IM  Preterm labor symptoms and general obstetric precautions including but not limited to vaginal bleeding, contractions, leaking of fluid and fetal movement were reviewed in detail with the patient. Please refer to After Visit Summary for other counseling recommendations.   No follow-ups on file.  Future Appointments  Date Time Provider Department Center  04/01/2024  7:30 AM Gretel Celena BROCKS, PT [facility] None  04/02/2024  1:30 PM Eveline Lynwood MATSU, MD [facility] None  04/09/2024 10:15 AM Gretel Celena C, PT [facility] None  04/16/2024 10:15 AM Gretel Celena C, PT [facility] None  04/17/2024  2:10 PM Stinson, Jacob J, DO [facility] None  04/23/2024 10:15 AM Gretel, Celena C, PT [facility] None  04/29/2024  2:10 PM Synthia Raisin, CNM [facility] None   04/30/2024 10:15 AM Gretel Celena C, PT [facility] None  05/15/2024  2:10 PM Stinson, Jacob J, DO [facility] None  05/22/2024 10:55 AM Stinson, Jacob J, DO [facility] None  05/29/2024  2:10 PM Stinson, Jacob J, DO [facility] None  06/05/2024  2:10 PM Stinson, Jacob J, DO [facility] None    Rollo ONEIDA Bring, MD

## 2024-03-20 NOTE — Addendum Note (Signed)
 Addended by: TANDA CREE L on: 03/20/2024 09:20 AM   Modules accepted: Orders

## 2024-03-21 LAB — CBC
Hematocrit: 36 % (ref 34.0–46.6)
Hemoglobin: 11.6 g/dL (ref 11.1–15.9)
MCH: 32.3 pg (ref 26.6–33.0)
MCHC: 32.2 g/dL (ref 31.5–35.7)
MCV: 100 fL — ABNORMAL HIGH (ref 79–97)
Platelets: 268 x10E3/uL (ref 150–450)
RBC: 3.59 x10E6/uL — ABNORMAL LOW (ref 3.77–5.28)
RDW: 12.4 % (ref 11.7–15.4)
WBC: 7 x10E3/uL (ref 3.4–10.8)

## 2024-03-21 LAB — HIV ANTIBODY (ROUTINE TESTING W REFLEX): HIV Screen 4th Generation wRfx: NONREACTIVE

## 2024-03-21 LAB — GLUCOSE, 1 HOUR GESTATIONAL: Gestational Diabetes Screen: 73 mg/dL (ref 70–139)

## 2024-03-21 LAB — SYPHILIS: RPR W/REFLEX TO RPR TITER AND TREPONEMAL ANTIBODIES, TRADITIONAL SCREENING AND DIAGNOSIS ALGORITHM: RPR Ser Ql: NONREACTIVE

## 2024-04-01 ENCOUNTER — Ambulatory Visit: Admitting: Physical Therapy

## 2024-04-01 DIAGNOSIS — R293 Abnormal posture: Secondary | ICD-10-CM

## 2024-04-01 DIAGNOSIS — M6281 Muscle weakness (generalized): Secondary | ICD-10-CM

## 2024-04-01 DIAGNOSIS — R279 Unspecified lack of coordination: Secondary | ICD-10-CM

## 2024-04-01 NOTE — Therapy (Signed)
 " OUTPATIENT PHYSICAL THERAPY FEMALE PELVIC TREATMENT   Patient Name: Joyce Stewart MRN: [id number] DOB:10/16/1990, 33 y.o., female Today's Date: 04/01/2024  END OF SESSION:  PT End of Session - 04/01/24 0801     Visit Number 5    Number of Visits 12    Date for Recertification  04/17/24    Authorization Type [employer]    PT Start Time [phone]    PT Stop Time 0802    PT Time Calculation (min) 26 min    Activity Tolerance Patient tolerated treatment well    Behavior During Therapy [facility] for tasks assessed/performed              Past Medical History:  Diagnosis Date   Chronic constipation 10/18/2017   Indication for care or intervention in labor or delivery 05/11/2022   Tendonitis of finger 05/15/2019   L thumb   Vaginal Pap smear, abnormal    Past Surgical History:  Procedure Laterality Date   NO PAST SURGERIES     Patient Active Problem List   Diagnosis Date Noted   Supervision of other normal pregnancy, antepartum 11/07/2023   GBS bacteriuria 04/24/2022   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 Yesterday she had a lot of tailbone pain, but she spent over 19 hours in the car over christmas. Pubic bone pain was present after car ride, but has gone away now. Tailbone pain is present today, 4/10 today feeling like stiffness. Leakage has been better recently.   Eval: Patient reports  to PFPT after having success with pelvic PT during her first pregnancy. She is currently pregnant with her second baby (18 weeks), no major discomfort currently, she is feeling relatively comfortable.  Fluid intake: moderate water intake, coconut water   FUNCTIONAL LIMITATIONS: no major functional limitations   PERTINENT HISTORY:  Medications for current condition: no Surgeries: no major surgical history  Other: no Sexual abuse: No  PAIN:  Are you having pain? No NPRS scale: 0/10  PRECAUTIONS: None  RED FLAGS: None   WEIGHT  BEARING RESTRICTIONS: No  FALLS:  Has patient fallen in last 6 months? 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, seated for job   ACTIVITY LEVEL : walks 1.5 miles per day, tries to do strength training 1-2x/week (dumbbells, yoga ball, yoga mat, bands)   PLOF: Independent with basic ADLs  PATIENT GOALS: support for the rest of the pregnancy and a baseline for recovering postpartum   BOWEL MOVEMENT: Pain with bowel movement: No Type of bowel movement:Type (Bristol Stool Scale) 3-4, Frequency every other day - always been this way, Strain no, and Splinting no Fully empty rectum: Yes: depends on the day Leakage: No                                                     Caused by: no  Pads: No Fiber supplement/laxative No  URINATION: Pain with urination: No Fully empty bladder: Yes:                                   Post-void dribble: No Stream: Strong Urgency: Yes  Frequency:during the day within normal limits                                                           Nocturia: No   Leakage: every once in a while when urgency is high  Pads/briefs: No  INTERCOURSE:  Ability to have vaginal penetration Yes  Pain with intercourse: Initial Penetration Dryness: Yes  Climax: yes Marinoff Scale: 1/3 Lubricant: yes   PREGNANCY: Vaginal deliveries 1 Tearing Yes: minimal tearing  with stitches  Episiotomy No C-section deliveries 0 Currently pregnant Yes: 18 weeks currently   PROLAPSE: None   OBJECTIVE:  Note: Objective measures were completed at Evaluation unless otherwise noted.  PATIENT SURVEYS:  PFIQ-7: 15  COGNITION: Overall cognitive status: Within functional limits for tasks assessed     SENSATION: Light touch: Appears intact  LUMBAR SPECIAL TESTS:  Single leg stance test: Positive  FUNCTIONAL TESTS:  Single leg stance:  Rt: +  Lt: +  Sit-up test: 1/3  Squat: within normal limits  Bed mobility: within normal limits   GAIT: Assistive device utilized: None Comments: mild trendelenburg gait pattern with ambulation   POSTURE: rounded shoulders, forward head, and increased thoracic kyphosis  LUMBARAROM/PROM: within normal limits for all motions with no pain bilaterally   A/PROM A/PROM  Eval (% available)  Flexion   Extension   Right lateral flexion   Left lateral flexion   Right rotation   Left rotation    (Blank rows = not tested)  LOWER EXTREMITY ROM: within normal limits for all motions bilaterally with no pain   LOWER EXTREMITY MMT:  MMT Right eval Left eval  Hip flexion 4 4  Hip extension 3 3  Hip abduction 3 3  Hip adduction 4 4  Hip internal rotation 3 3  Hip external rotation 4 4  Knee flexion 4 4  Knee extension 4 4  Ankle dorsiflexion    Ankle plantarflexion  Ankle inversion    Ankle eversion     (Blank rows = not tested) PALPATION:  General: no tenderness to palpation of bilateral hip flexors or adductors   Pelvic Alignment: within normal limits mild anterior pelvic tilt   Abdominal: abdominal bracing at rest  Diastasis: Yes: at and above umbilicus 2 finger widths  Distortion: No  Breathing: apical breathing pattern  Scar tissue: No                External Perineal Exam: moisture levels intact with sufficient clitoral hood mobility present                             Internal Pelvic Floor:  Patient fully consents to today's internal vaginal examination. She wishes to get an understanding of her baseline pelvic floor function so that she can have optimal outcomes during pregnancy and postpartum. She demonstrates a well timed, moderately strong pelvic floor contraction that is uncoordinated with her diaphragm. She utilizes full pelvic floor range of motion when she pairs a pelvic contraction with an exhale. No pain or palpable trigger points during today's exam.  Patient confirms identification and approves PT to assess internal pelvic floor and treatment Yes No emotional/communication barriers or cognitive limitation. Patient is motivated to learn. Patient understands and agrees with treatment goals and plan. PT explains patient will be examined in standing, sitting, and lying down to see how their muscles and joints work. When they are ready, they will be asked to remove their underwear so PT can examine their perineum. The patient is also given the option of providing their own chaperone as one is not provided in our facility. The patient also has the right and is explained the right to defer or refuse any part of the evaluation or treatment including the internal exam. With the patient's consent, PT will use one gloved finger to gently assess the muscles of the pelvic floor, seeing how well it contracts and relaxes and if there is muscle symmetry. After, the patient will get dressed and PT and patient will discuss exam findings and plan of care. PT and patient discuss plan of care, schedule, attendance policy and HEP activities.  PELVIC MMT:   MMT eval  Vaginal 3/5, 10 quick flicks, 6 second hold  Internal Anal Sphincter   External Anal Sphincter   Puborectalis   Diastasis Recti 2 fingers at and above umbilicus   (Blank rows = not tested)       TONE: Within normal limits bilaterally in superficial and deep pelvic floor musculature   PROLAPSE: None present in hooklying with cough  test  TODAY'S TREATMENT:                                                                                                                              DATE:   03/13/24: Nustep level 1 - 3 minutes - PT present to discuss  current status  Cat/cow + diaphragmatic breathing 2x72min  Bird dog + diaphragmatic breathing 2x10 each side  Seated reclined internal and external hip rotation + diaphragmatic breathing 2x73min  Supine figure 4 stretch + diaphragmatic breathing 2x42min  Seated abdominal ball press + transverse abdominis contraction + diaphragmatic breathing 2x46min   03/19/24: Nustep level 1 - 5 minutes - PT present to discuss current status  Seated abdominal ball press + transverse abdominis contraction + diaphragmatic breathing 2x41min  Bird dog + diaphragmatic breathing x10  Primal push up + diaphragmatic breathing 2x10  Seated adductor ball squeeze + transverse abdominis contraction + diaphragmatic breathing 2x48min Seated ball roll outs fwd/laterally each side x5  Seated ball hip circles + diaphragmatic breathing x10 each  Urge drill education for managing urge urinary incontinence when bladder is full  Toileting positioning for ease of evacuation of stool   04/01/24: Manual therapy: Sacroiliac joint release in right sidelying  Sacral distraction in right sidelying x10  Cupping to right sacroiliac joint 5 min  Pigeon pose + diaphragmatic breathing 2x60min each side  Seated cat/cow + diaphragmatic breathing 2x10  Quadruped rocking into right hip/left hip + diaphragmatic breathing x10 each   PATIENT EDUCATION:  Education details: relative anatomy and the connection between the diaphragm and pelvic floor Person educated: Patient Education method: Explanation, Demonstration, Tactile cues, Verbal cues, and Handouts Education comprehension: verbalized understanding, returned demonstration, verbal cues required, tactile cues required, and needs further education  HOME EXERCISE  PROGRAM: Access Code: [email or web address] URL: [email or web address] Date: 04/01/2024 Prepared by: Celena Domino  Exercises - Seated Pelvic Floor Contraction  - 1 x daily - 7 x weekly - 2 sets - 10 reps - Supine Bilateral Hip Internal Rotation Stretch  - 1 x daily - 7 x weekly - 2 sets - 10 reps - Sidelying Reverse Clamshell  - 1 x daily - 7 x weekly - 2 sets - 10 reps - Sidelying Open Book Thoracic Rotation with Knee on Foam Roll  - 1 x daily - 7 x weekly - 2 sets - 10 reps - Quadruped Rocking Slow  - 1 x daily - 7 x weekly - 2 sets - 3 reps - 45s hold - Bird Dog  - 1 x daily - 7 x weekly - 2 sets - 10 reps - Seated 3 Way Exercise Ball Roll Out Stretch  - 1 x daily - 7 x weekly - 2 sets - 10 reps - Cat Cow  - 1 x daily - 7 x weekly - 2 sets - 10 reps - Supine Figure 4 Piriformis Stretch  - 1 x daily - 7 x weekly - 2 sets - 1 min hold - Seated Combined Hip Internal and External Rotation AROM: Hip Switch  - 1 x daily - 7 x weekly - 2 sets - 10 reps - Seated Abdominal Press into [employer]  - 1 x daily - 7 x weekly - 2 sets - 10 reps - Seated Hip Adduction Squeeze with Ball  - 1 x daily - 7 x weekly - 2 sets - 10 reps - Primal Push Up  - 1 x daily - 7 x weekly - 2 sets - 5 reps - Seated 3 Way Exercise Ball Roll Out Stretch  - 1 x daily - 7 x weekly - 2 sets - 5 reps - Pelvic Circles on Swiss Ball  - 1 x daily - 7 x weekly - 2 sets - 10 reps - [employer]  - 1 x daily -  7 x weekly - 2 sets - hold  ASSESSMENT:  CLINICAL IMPRESSION: Patient is a 33 y.o. female  who was seen today for physical therapy treatment for antepartum pelvic floor maintenance care during second pregnancy. She arrives with 4/10 tailbone/SIJ pain on the right side, so manual therapy was performed to promote blood flow and relaxation to this space. Pt enjoyed sidelying sacral mobilization/distraction. Pigeon pose targeted the area of tightness that she has been feeling in the right glute. No pubic bone pain by the end  of today's session. Pt tolerated session well and Pt would benefit from additional PT to further address deficits.    OBJECTIVE IMPAIRMENTS: decreased coordination, decreased endurance, decreased mobility, decreased ROM, and decreased strength.   ACTIVITY LIMITATIONS: carrying, lifting, bending, sitting, standing, squatting, stairs, transfers, bed mobility, continence, and toileting  PARTICIPATION LIMITATIONS: cleaning, laundry, interpersonal relationship, driving, shopping, community activity, and yard work  PERSONAL FACTORS: Age, Past/current experiences, and Time since onset of injury/illness/exacerbation are also affecting patient's functional outcome.   REHAB POTENTIAL: Good  CLINICAL DECISION MAKING: Stable/uncomplicated  EVALUATION COMPLEXITY: Low   GOALS: Goals reviewed with patient? Yes  SHORT TERM GOALS: Target date: 02/13/2024  Pt will be independent with HEP.  Baseline: Goal status: INITIAL  2.  Pt will be independent with use of squatty potty, relaxed toileting mechanics, and improved bowel movement techniques in order to increase ease of bowel movements and complete evacuation.  Baseline:  Goal status: INITIAL  3.  Pt will be independent with the knack, urge suppression technique, and double voiding in order to improve bladder habits and decrease urinary incontinence.  Baseline:  Goal status: INITIAL  LONG TERM GOALS: Target date: 07/16/2024  Pt will be independent with advanced HEP.  Baseline:  Goal status: INITIAL  2.  Pt to demonstrate improved coordination of pelvic floor and breathing mechanics with 10# squat with appropriate synergistic patterns to decrease pain and leakage at least 75% of the time for improved ability to complete a 30 minute workout with strain at pelvic floor and symptoms.   Baseline:  Goal status: INITIAL  3.  Pt will be able to correctly perform diaphragmatic breathing and appropriate pressure management in order to prevent worsening  vaginal wall laxity and improve pelvic floor A/ROM.  Baseline:  Goal status: INITIAL  PLAN:  PT FREQUENCY: 1-2x/week  PT DURATION: 6 months  PLANNED INTERVENTIONS: 97110-Therapeutic exercises, 97530- Therapeutic activity, 97112- Neuromuscular re-education, 97535- Self Care, [id number]- Manual therapy, Patient/Family education, Taping, Joint mobilization, Spinal mobilization, Scar mobilization, Cryotherapy, Moist heat, and Biofeedback  PLAN FOR NEXT SESSION: continued birth and labor prep, toileting mechanics for optimal emptying, core strengthening exercises   Celena JAYSON Domino, PT 04/01/2024, 8:01 AM [facility] [address] [place], [place] [address] Phone # [phone] Fax [phone] "

## 2024-04-02 ENCOUNTER — Ambulatory Visit: Admitting: Obstetrics & Gynecology

## 2024-04-02 VITALS — BP 91/53 | HR 81 | Wt 149.0 lb

## 2024-04-02 DIAGNOSIS — Z3A30 30 weeks gestation of pregnancy: Secondary | ICD-10-CM | POA: Diagnosis not present

## 2024-04-02 DIAGNOSIS — Z3480 Encounter for supervision of other normal pregnancy, unspecified trimester: Secondary | ICD-10-CM

## 2024-04-02 NOTE — Progress Notes (Signed)
 "  PRENATAL VISIT NOTE  Subjective:  Joyce Stewart is a 33 y.o. G4P1021 at [redacted]w[redacted]d being seen today for ongoing prenatal care.  She is currently monitored for the following issues for this low-risk pregnancy and has LGSIL on Pap smear of cervix; GBS bacteriuria; and Supervision of other normal pregnancy, antepartum on their problem list.  Patient reports vericose vs.  Contractions: Not present. Vag. Bleeding: None.  Movement: Present. Denies leaking of fluid.   The following portions of the patient's history were reviewed and updated as appropriate: allergies, current medications, past family history, past medical history, past social history, past surgical history and problem list.   Objective:   Vitals:   04/02/24 1334  BP: (!) 91/53  Pulse: 81  Weight: 149 lb (67.6 kg)    Fetal Status:  Fetal Heart Rate (bpm): 149 Fundal Height: 30 cm Movement: Present    General: Alert, oriented and cooperative. Patient is in no acute distress.  Skin: Skin is warm and dry. No rash noted.   Cardiovascular: Normal heart rate noted  Respiratory: Normal respiratory effort, no problems with respiration noted  Abdomen: Soft, gravid, appropriate for gestational age.  Pain/Pressure: Absent     Pelvic: Cervical exam deferred        Extremities: Normal range of motion.  Edema: None  Mental Status: Normal mood and affect. Normal behavior. Normal judgment and thought content.      03/20/2024    8:39 AM 11/22/2023    9:14 AM 12/20/2022   10:08 AM  Depression screen PHQ 2/9  Decreased Interest 0 0 0  Down, Depressed, Hopeless 0 1 0  PHQ - 2 Score 0 1 0  Altered sleeping 0 0 0  Tired, decreased energy 1 1 1   Change in appetite 0 0 0  Feeling bad or failure about yourself  0 0 0  Trouble concentrating 0 0 0  Moving slowly or fidgety/restless 0 0 0  Suicidal thoughts 0 0 0  PHQ-9 Score 1 2  1       Data saved with a previous flowsheet row definition        03/20/2024    8:39 AM 11/22/2023     9:14 AM 12/20/2022   10:09 AM 12/11/2022    9:25 AM  GAD 7 : Generalized Anxiety Score  Nervous, Anxious, on Edge 0 0 1 1  Control/stop worrying 0 0 0 0  Worry too much - different things 0 0 0 0  Trouble relaxing 0 0 0 1  Restless 0 0 0 0  Easily annoyed or irritable 0 0 0 1  Afraid - awful might happen 0 0 1 1  Total GAD 7 Score 0 0 2 4  Anxiety Difficulty    Not difficult at all    Assessment and Plan:  Pregnancy: G4P1021 at [redacted]w[redacted]d 1. [redacted] weeks gestation of pregnancy (Primary)   2. Supervision of other normal pregnancy, antepartum   Preterm labor symptoms and general obstetric precautions including but not limited to vaginal bleeding, contractions, leaking of fluid and fetal movement were reviewed in detail with the patient. Please refer to After Visit Summary for other counseling recommendations.   Return in about 2 weeks (around 04/16/2024).  Future Appointments  Date Time Provider Department Center  04/09/2024 10:15 AM Gretel Kendall C, PT [facility] None  04/16/2024 10:15 AM Gretel Kendall C, PT [facility] None  04/17/2024  2:10 PM Stinson, Jacob J, DO [facility] None  04/23/2024 10:15 AM Price, Kendall BROCKS, PT [facility] None  04/29/2024  2:10 PM Synthia Raisin, CNM [facility] None  04/30/2024 10:15 AM Gretel Kendall C, PT [facility] None  05/15/2024  2:10 PM Stinson, Jacob J, DO [facility] None  05/21/2024  9:35 AM Stinson, Jacob J, DO [facility] None  05/29/2024  2:10 PM Stinson, Jacob J, DO [facility] None  06/05/2024  2:10 PM Barbra Jacob J, DO [facility] None    Lynwood Solomons, MD  "

## 2024-04-09 ENCOUNTER — Ambulatory Visit: Attending: Obstetrics and Gynecology | Admitting: Physical Therapy

## 2024-04-09 DIAGNOSIS — R279 Unspecified lack of coordination: Secondary | ICD-10-CM | POA: Diagnosis present

## 2024-04-09 DIAGNOSIS — R293 Abnormal posture: Secondary | ICD-10-CM | POA: Diagnosis present

## 2024-04-09 DIAGNOSIS — M6281 Muscle weakness (generalized): Secondary | ICD-10-CM | POA: Diagnosis present

## 2024-04-09 DIAGNOSIS — R102 Pelvic and perineal pain unspecified side: Secondary | ICD-10-CM | POA: Diagnosis present

## 2024-04-09 NOTE — Therapy (Signed)
 " OUTPATIENT PHYSICAL THERAPY FEMALE PELVIC TREATMENT   Patient Name: Joyce Stewart MRN: [id number] DOB:11-Dec-1990, 34 y.o., female Today's Date: 04/09/2024  END OF SESSION:  PT End of Session - 04/09/24 1104     Visit Number 6    Number of Visits 12    Date for Recertification  04/17/24    Authorization Type [employer]    PT Start Time 1015    PT Stop Time 1100    PT Time Calculation (min) 45 min    Activity Tolerance Patient tolerated treatment well    Behavior During Therapy [facility] for tasks assessed/performed               Past Medical History:  Diagnosis Date   Chronic constipation 10/18/2017   Indication for care or intervention in labor or delivery 05/11/2022   Tendonitis of finger 05/15/2019   L thumb   Vaginal Pap smear, abnormal    Past Surgical History:  Procedure Laterality Date   NO PAST SURGERIES     Patient Active Problem List   Diagnosis Date Noted   Supervision of other normal pregnancy, antepartum 11/07/2023   GBS bacteriuria 04/24/2022   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    PCP: Almarie Waddell NOVAK, NP  REFERRING PROVIDER: Abigail Rollo DASEN, MD   REFERRING DIAG: Z34.80 (ICD-10-CM) - Supervision of other normal pregnancy, antepartum  THERAPY DIAG:  Muscle weakness (generalized)  Unspecified lack of coordination  Abnormal posture  Rationale for Evaluation and Treatment: Rehabilitation  ONSET DATE: 2025  SUBJECTIVE:                                                                                                                                                                                           SUBJECTIVE STATEMENT: *next visit: go over perineal stretching in prep for birth  [redacted] weeks pregnant today. Lots of discomfort in the past week. She was standing for 20 minutes yesterday and had pelvic heaviness and the outer part other vagina felt sore and tired. Right hip has been very uncomfortable. No pubic bone pain to report today. Tailbone  pain has evolved into lower back soreness. Leakage has been controlled and constipation is present also.   Eval: Patient reports to PFPT after having success with pelvic PT during her first pregnancy. She is currently pregnant with her second baby (18 weeks), no major discomfort currently, she is feeling relatively comfortable.  Fluid intake: moderate water intake, coconut water   FUNCTIONAL LIMITATIONS: no major functional limitations   PERTINENT HISTORY:  Medications for current condition: no Surgeries: no major surgical history  Other: no Sexual abuse: No  PAIN:  Are you having pain? No NPRS scale: 0/10  PRECAUTIONS: None  RED FLAGS: None   WEIGHT BEARING RESTRICTIONS: No  FALLS:  Has patient fallen in last 6 months? 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, seated for job   ACTIVITY LEVEL : walks 1.5 miles per day, tries to do strength training 1-2x/week (dumbbells, yoga ball, yoga mat, bands)   PLOF: Independent with basic ADLs  PATIENT GOALS: support for the rest of the pregnancy and a baseline for recovering postpartum   BOWEL MOVEMENT: Pain with bowel movement: No Type of bowel movement:Type (Bristol Stool Scale) 3-4, Frequency every other day - always been this way, Strain no, and Splinting no Fully empty rectum: Yes: depends on the day Leakage: No                                                     Caused by: no  Pads: No Fiber supplement/laxative No  URINATION: Pain with urination: No Fully empty bladder: Yes:                                   Post-void dribble: No Stream: Strong Urgency: Yes  Frequency:during the day within normal limits                                                           Nocturia: No   Leakage: every once in a while when urgency is high  Pads/briefs: No  INTERCOURSE:  Ability to have vaginal penetration Yes  Pain with intercourse: Initial  Penetration Dryness: Yes  Climax: yes Marinoff Scale: 1/3 Lubricant: yes   PREGNANCY: Vaginal deliveries 1 Tearing Yes: minimal tearing with stitches  Episiotomy No C-section deliveries 0 Currently pregnant Yes: 18 weeks currently   PROLAPSE: None   OBJECTIVE:  Note: Objective measures were completed at Evaluation unless otherwise noted.  PATIENT SURVEYS:  PFIQ-7: 15  COGNITION: Overall cognitive status: Within functional limits for tasks assessed     SENSATION: Light touch: Appears intact  LUMBAR SPECIAL TESTS:  Single leg stance test: Positive  FUNCTIONAL TESTS:  Single leg stance:  Rt: +  Lt: +  Sit-up test: 1/3  Squat: within normal limits  Bed mobility: within normal limits   GAIT: Assistive device utilized: None Comments: mild trendelenburg gait pattern with ambulation   POSTURE: rounded shoulders, forward head, and increased thoracic kyphosis  LUMBARAROM/PROM: within normal limits for all motions with no pain bilaterally   A/PROM A/PROM  Eval (% available)  Flexion   Extension   Right lateral flexion   Left lateral flexion   Right rotation   Left rotation    (Blank rows = not tested)  LOWER EXTREMITY ROM: within normal limits for all motions bilaterally with no pain   LOWER EXTREMITY MMT:  MMT Right eval Left eval  Hip flexion 4 4  Hip extension 3 3  Hip abduction 3 3  Hip adduction 4 4  Hip internal rotation 3 3  Hip external rotation 4  4  Knee flexion 4 4  Knee extension 4 4  Ankle dorsiflexion    Ankle plantarflexion    Ankle inversion    Ankle eversion     (Blank rows = not tested) PALPATION:  General: no tenderness to palpation of bilateral hip flexors or adductors   Pelvic Alignment: within normal limits mild anterior pelvic tilt   Abdominal: abdominal bracing at rest  Diastasis: Yes: at and above umbilicus 2 finger widths  Distortion: No  Breathing: apical breathing pattern  Scar tissue: No                External  Perineal Exam: moisture levels intact with sufficient clitoral hood mobility present                             Internal Pelvic Floor: Patient fully consents to today's internal vaginal examination. She wishes to get an understanding of her baseline pelvic floor function so that she can have optimal outcomes during pregnancy and postpartum. She demonstrates a well timed, moderately strong pelvic floor contraction that is uncoordinated with her diaphragm. She utilizes full pelvic floor range of motion when she pairs a pelvic contraction with an exhale. No pain or palpable trigger points during today's exam.  Patient confirms identification and approves PT to assess internal pelvic floor and treatment Yes No emotional/communication barriers or cognitive limitation. Patient is motivated to learn. Patient understands and agrees with treatment goals and plan. PT explains patient will be examined in standing, sitting, and lying down to see how their muscles and joints work. When they are ready, they will be asked to remove their underwear so PT can examine their perineum. The patient is also given the option of providing their own chaperone as one is not provided in our facility. The patient also has the right and is explained the right to defer or refuse any part of the evaluation or treatment including the internal exam. With the patient's consent, PT will use one gloved finger to gently assess the muscles of the pelvic floor, seeing how well it contracts and relaxes and if there is muscle symmetry. After, the patient will get dressed and PT and patient will discuss exam findings and plan of care. PT and patient discuss plan of care, schedule, attendance policy and HEP activities.  PELVIC MMT:   MMT eval  Vaginal 3/5, 10 quick flicks, 6 second hold  Internal Anal Sphincter   External Anal Sphincter   Puborectalis   Diastasis Recti 2 fingers at and above umbilicus   (Blank rows = not tested)        TONE: Within normal limits bilaterally in superficial and deep pelvic floor musculature   PROLAPSE: None present in hooklying with cough test  TODAY'S TREATMENT:  DATE:   03/19/24: Nustep level 1 - 5 minutes - PT present to discuss current status  Seated abdominal ball press + transverse abdominis contraction + diaphragmatic breathing 2x90min  Bird dog + diaphragmatic breathing x10  Primal push up + diaphragmatic breathing 2x10  Seated adductor ball squeeze + transverse abdominis contraction + diaphragmatic breathing 2x33min Seated ball roll outs fwd/laterally each side x5  Seated ball hip circles + diaphragmatic breathing x10 each  Urge drill education for managing urge urinary incontinence when bladder is full  Toileting positioning for ease of evacuation of stool   04/01/24: Manual therapy: Sacroiliac joint release in right sidelying  Sacral distraction in right sidelying x10  Cupping to right sacroiliac joint 5 min  Pigeon pose + diaphragmatic breathing 2x52min each side  Seated cat/cow + diaphragmatic breathing 2x10  Quadruped rocking into right hip/left hip + diaphragmatic breathing x10 each   04/09/24: Bike level 2, 4 minutes - PT present to discuss current status  Seated thoracic/lumbar extension over back of chair to promote rib opening and diaphragm release  Standing pull down (RTB)  + single leg march + diaphragmatic breathing  2x10  Seated physioball pelvic tilts + diaphragmatic breathing 2x10  Seated physioball roll outs + diaphragmatic breathing 2x5 each direction (fwd/lateral) Seated physioball adductor stretch + diaphragmatic breathing 2x2min  Seated physioball lateral pelvic tilts + diaphragmatic breathing 2x63min  Seated reclined internal and external rotation of the hips + diaphragmatic breathing  Tall kneeling adductor rocking stretch  with physioball + diaphragmatic breathing x10 each side (not added to HEP since she is doing this independently)   PATIENT EDUCATION:  Education details: relative anatomy and the connection between the diaphragm and pelvic floor Person educated: Patient Education method: Explanation, Demonstration, Tactile cues, Verbal cues, and Handouts Education comprehension: verbalized understanding, returned demonstration, verbal cues required, tactile cues required, and needs further education  HOME EXERCISE PROGRAM: Access Code: [email or web address] URL: [email or web address] Date: 04/09/2024 Prepared by: Celena Domino  Exercises - Seated Pelvic Floor Contraction  - 1 x daily - 7 x weekly - 2 sets - 10 reps - Sidelying Open Book Thoracic Rotation with Knee on Foam Roll  - 1 x daily - 7 x weekly - 2 sets - 10 reps - Quadruped Rocking Slow  - 1 x daily - 7 x weekly - 2 sets - 3 reps - 45s hold - Supine Bilateral Hip Internal Rotation Stretch  - 1 x daily - 7 x weekly - 2 sets - 10 reps - Sidelying Reverse Clamshell  - 1 x daily - 7 x weekly - 2 sets - 10 reps - Seated Hip Adduction Squeeze with Ball  - 1 x daily - 7 x weekly - 2 sets - 10 reps - Bird Dog  - 1 x daily - 7 x weekly - 2 sets - 10 reps - Primal Push Up  - 1 x daily - 7 x weekly - 2 sets - 5 reps - Cat Cow  - 1 x daily - 7 x weekly - 2 sets - 10 reps - Pigeon Pose  - 1 x daily - 7 x weekly - 2 sets - hold - Seated Piriformis Stretch  - 1 x daily - 7 x weekly - 2 sets - hold - Pelvic Circles on Swiss Ball  - 1 x daily - 7 x weekly - 2 sets - 10 reps - Seated 3 Way Exercise Emcor Stretch  - 1 x daily - 7 x  weekly - 2 sets - 10 reps - Seated Combined Hip Internal and External Rotation AROM: Hip Switch  - 1 x daily - 7 x weekly - 2 sets - 10 reps - Seated Thoracic Lumbar Extension with Pectoralis Stretch  - 1 x daily - 7 x weekly - 2 sets - 10 reps - Resistance Pulldown with March  - 1 x daily - 7 x weekly - 2 sets - 10  reps - Pelvic Tilt on Swiss Ball  - 1 x daily - 7 x weekly - 2 sets - 10 reps - Seated Lateral Pelvic Tilt on Swiss Ball  - 1 x daily - 7 x weekly - 2 sets - 10 reps - Seated Adductor Stretch on Swiss Ball  - 1 x daily - 7 x weekly - 2 sets - hold  ASSESSMENT:  CLINICAL IMPRESSION: Patient is a 34 y.o. female  who was seen today for physical therapy treatment for antepartum pelvic floor maintenance care during second pregnancy. She arrives with mild low back and hip pain. Her pelvic floor has been feeling tighter and more tired with prolonged weightbearing. Pt educated on progressive birth prep exercises and provided with thoracic extension to HEP to decrease rib/diaphragm pain. No pubic bone pain by the end of today's session. Pt tolerated session well and Pt would benefit from additional PT to further address deficits.    OBJECTIVE IMPAIRMENTS: decreased coordination, decreased endurance, decreased mobility, decreased ROM, and decreased strength.   ACTIVITY LIMITATIONS: carrying, lifting, bending, sitting, standing, squatting, stairs, transfers, bed mobility, continence, and toileting  PARTICIPATION LIMITATIONS: cleaning, laundry, interpersonal relationship, driving, shopping, community activity, and yard work  PERSONAL FACTORS: Age, Past/current experiences, and Time since onset of injury/illness/exacerbation are also affecting patient's functional outcome.   REHAB POTENTIAL: Good  CLINICAL DECISION MAKING: Stable/uncomplicated  EVALUATION COMPLEXITY: Low   GOALS: Goals reviewed with patient? Yes  SHORT TERM GOALS: Target date: 02/13/2024  Pt will be independent with HEP.  Baseline: Goal status: INITIAL  2.  Pt will be independent with use of squatty potty, relaxed toileting mechanics, and improved bowel movement techniques in order to increase ease of bowel movements and complete evacuation.  Baseline:  Goal status: INITIAL  3.  Pt will be independent with the knack,  urge suppression technique, and double voiding in order to improve bladder habits and decrease urinary incontinence.  Baseline:  Goal status: INITIAL  LONG TERM GOALS: Target date: 07/16/2024  Pt will be independent with advanced HEP.  Baseline:  Goal status: INITIAL  2.  Pt to demonstrate improved coordination of pelvic floor and breathing mechanics with 10# squat with appropriate synergistic patterns to decrease pain and leakage at least 75% of the time for improved ability to complete a 30 minute workout with strain at pelvic floor and symptoms.   Baseline:  Goal status: INITIAL  3.  Pt will be able to correctly perform diaphragmatic breathing and appropriate pressure management in order to prevent worsening vaginal wall laxity and improve pelvic floor A/ROM.  Baseline:  Goal status: INITIAL  PLAN:  PT FREQUENCY: 1-2x/week  PT DURATION: 6 months  PLANNED INTERVENTIONS: 97110-Therapeutic exercises, 97530- Therapeutic activity, 97112- Neuromuscular re-education, 97535- Self Care, [id number]- Manual therapy, Patient/Family education, Taping, Joint mobilization, Spinal mobilization, Scar mobilization, Cryotherapy, Moist heat, and Biofeedback  PLAN FOR NEXT SESSION: continued birth and labor prep, toileting mechanics for optimal emptying, core strengthening exercises   Celena JAYSON Domino, PT 04/09/2024, 11:05 AM [facility] [address],  [address] [place], [place] [address] Phone # [phone] Fax [phone] "

## 2024-04-16 ENCOUNTER — Ambulatory Visit: Admitting: Physical Therapy

## 2024-04-16 DIAGNOSIS — M6281 Muscle weakness (generalized): Secondary | ICD-10-CM | POA: Diagnosis not present

## 2024-04-16 DIAGNOSIS — R293 Abnormal posture: Secondary | ICD-10-CM

## 2024-04-16 DIAGNOSIS — R279 Unspecified lack of coordination: Secondary | ICD-10-CM

## 2024-04-16 NOTE — Patient Instructions (Signed)
 Perineal Stretching Routine: Positioning: Have your tool ready and your water based lubricant ready as well  Make sure you are relaxed and your hips and core are not holding tension  How often: 3-4 days per week moving forward each week of this pregnancy  5-10 minutes at a time  Techniques: Insert the wand up until you reach the curvature in the wand  Assess for pain or discomfort, if there is discomfort, try taking 3 deep belly breaths to decrease this pain  Once you are comfortable, try stretching the following ways: Down and to the right for a 5 second hold + belly breathing  Down straight towards the anus for a 5 second hold + belly breathing  Down and the left for 5 second hold + belly breathing  Repeat this sequence 3-5 times as long as you have no pain or soreness following the stretching

## 2024-04-16 NOTE — Therapy (Signed)
 " OUTPATIENT PHYSICAL THERAPY FEMALE PELVIC TREATMENT   Patient Name: Joyce Stewart MRN: [id number] DOB:[birth year], 34 y.o., female Today's Date: 04/16/2024  END OF SESSION:  PT End of Session - 04/16/24 1100     Visit Number 7    Number of Visits 12    Date for Recertification  04/17/24    Authorization Type [employer]    PT Start Time 1015    PT Stop Time 1100    PT Time Calculation (min) 45 min    Activity Tolerance Patient tolerated treatment well    Behavior During Therapy [facility] for tasks assessed/performed                Past Medical History:  Diagnosis Date   Chronic constipation 10/18/2017   Indication for care or intervention in labor or delivery 05/11/2022   Tendonitis of finger 05/15/2019   L thumb   Vaginal Pap smear, abnormal    Past Surgical History:  Procedure Laterality Date   NO PAST SURGERIES     Patient Active Problem List   Diagnosis Date Noted   Supervision of other normal pregnancy, antepartum 11/07/2023   GBS bacteriuria 04/24/2022   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    PCP: Almarie Waddell NOVAK, NP  REFERRING PROVIDER: Abigail Rollo DASEN, MD   REFERRING DIAG: Z34.80 (ICD-10-CM) - Supervision of other normal pregnancy, antepartum  THERAPY DIAG:  Muscle weakness (generalized)  Unspecified lack of coordination  Abnormal posture  Rationale for Evaluation and Treatment: Rehabilitation  ONSET DATE: 2025  SUBJECTIVE:                                                                                                                                                                                           SUBJECTIVE STATEMENT: Patient reports that she is [redacted] weeks pregnant. Brought in perineal stretching tool from previous pregnancy. She has been pretty comfortable for the past week. Less constipated this week. The usual right hip tightness is present but it really wasn't too bad this week.   Eval: Patient reports to PFPT after having success  with pelvic PT during her first pregnancy. She is currently pregnant with her second baby (18 weeks), no major discomfort currently, she is feeling relatively comfortable.  Fluid intake: moderate water intake, coconut water   FUNCTIONAL LIMITATIONS: no major functional limitations   PERTINENT HISTORY:  Medications for current condition: no Surgeries: no major surgical history  Other: no Sexual abuse: No  PAIN:  Are you having pain? No NPRS scale: 0/10  PRECAUTIONS: None  RED FLAGS: None   WEIGHT BEARING RESTRICTIONS: No  FALLS:  Has patient  fallen in last 6 months? 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, seated for job   ACTIVITY LEVEL : walks 1.5 miles per day, tries to do strength training 1-2x/week (dumbbells, yoga ball, yoga mat, bands)   PLOF: Independent with basic ADLs  PATIENT GOALS: support for the rest of the pregnancy and a baseline for recovering postpartum   BOWEL MOVEMENT: Pain with bowel movement: No Type of bowel movement:Type (Bristol Stool Scale) 3-4, Frequency every other day - always been this way, Strain no, and Splinting no Fully empty rectum: Yes: depends on the day Leakage: No                                                     Caused by: no  Pads: No Fiber supplement/laxative No  URINATION: Pain with urination: No Fully empty bladder: Yes:                                   Post-void dribble: No Stream: Strong Urgency: Yes  Frequency:during the day within normal limits                                                           Nocturia: No   Leakage: every once in a while when urgency is high  Pads/briefs: No  INTERCOURSE:  Ability to have vaginal penetration Yes  Pain with intercourse: Initial Penetration Dryness: Yes  Climax: yes Marinoff Scale: 1/3 Lubricant: yes   PREGNANCY: Vaginal deliveries 1 Tearing Yes: minimal tearing with stitches  Episiotomy  No C-section deliveries 0 Currently pregnant Yes: 18 weeks currently   PROLAPSE: None   OBJECTIVE:  Note: Objective measures were completed at Evaluation unless otherwise noted.  PATIENT SURVEYS:  PFIQ-7: 15  COGNITION: Overall cognitive status: Within functional limits for tasks assessed     SENSATION: Light touch: Appears intact  LUMBAR SPECIAL TESTS:  Single leg stance test: Positive  FUNCTIONAL TESTS:  Single leg stance:  Rt: +  Lt: +  Sit-up test: 1/3  Squat: within normal limits  Bed mobility: within normal limits   GAIT: Assistive device utilized: None Comments: mild trendelenburg gait pattern with ambulation   POSTURE: rounded shoulders, forward head, and increased thoracic kyphosis  LUMBARAROM/PROM: within normal limits for all motions with no pain bilaterally   A/PROM A/PROM  Eval (% available)  Flexion   Extension   Right lateral flexion   Left lateral flexion   Right rotation   Left rotation    (Blank rows = not tested)  LOWER EXTREMITY ROM: within normal limits for all motions bilaterally with no pain   LOWER EXTREMITY MMT:  MMT Right eval Left eval  Hip flexion 4 4  Hip extension 3 3  Hip abduction 3 3  Hip adduction 4 4  Hip internal rotation 3 3  Hip external rotation 4 4  Knee flexion 4 4  Knee extension 4 4  Ankle dorsiflexion    Ankle plantarflexion    Ankle inversion    Ankle  eversion     (Blank rows = not tested) PALPATION:  General: no tenderness to palpation of bilateral hip flexors or adductors   Pelvic Alignment: within normal limits mild anterior pelvic tilt   Abdominal: abdominal bracing at rest  Diastasis: Yes: at and above umbilicus 2 finger widths  Distortion: No  Breathing: apical breathing pattern  Scar tissue: No                External Perineal Exam: moisture levels intact with sufficient clitoral hood mobility present                             Internal Pelvic Floor: Patient fully consents to  today's internal vaginal examination. She wishes to get an understanding of her baseline pelvic floor function so that she can have optimal outcomes during pregnancy and postpartum. She demonstrates a well timed, moderately strong pelvic floor contraction that is uncoordinated with her diaphragm. She utilizes full pelvic floor range of motion when she pairs a pelvic contraction with an exhale. No pain or palpable trigger points during today's exam.  Patient confirms identification and approves PT to assess internal pelvic floor and treatment Yes No emotional/communication barriers or cognitive limitation. Patient is motivated to learn. Patient understands and agrees with treatment goals and plan. PT explains patient will be examined in standing, sitting, and lying down to see how their muscles and joints work. When they are ready, they will be asked to remove their underwear so PT can examine their perineum. The patient is also given the option of providing their own chaperone as one is not provided in our facility. The patient also has the right and is explained the right to defer or refuse any part of the evaluation or treatment including the internal exam. With the patient's consent, PT will use one gloved finger to gently assess the muscles of the pelvic floor, seeing how well it contracts and relaxes and if there is muscle symmetry. After, the patient will get dressed and PT and patient will discuss exam findings and plan of care. PT and patient discuss plan of care, schedule, attendance policy and HEP activities.  PELVIC MMT:   MMT eval  Vaginal 3/5, 10 quick flicks, 6 second hold  Internal Anal Sphincter   External Anal Sphincter   Puborectalis   Diastasis Recti 2 fingers at and above umbilicus   (Blank rows = not tested)       TONE: Within normal limits bilaterally in superficial and deep pelvic floor musculature   PROLAPSE: None present in hooklying with cough test  TODAY'S TREATMENT:                                                                                                                               DATE:   03/19/24: Nustep level 1 - 5 minutes - PT present to discuss current status  Seated abdominal ball  press + transverse abdominis contraction + diaphragmatic breathing 2x11min  Bird dog + diaphragmatic breathing x10  Primal push up + diaphragmatic breathing 2x10  Seated adductor ball squeeze + transverse abdominis contraction + diaphragmatic breathing 2x36min Seated ball roll outs fwd/laterally each side x5  Seated ball hip circles + diaphragmatic breathing x10 each  Urge drill education for managing urge urinary incontinence when bladder is full  Toileting positioning for ease of evacuation of stool   04/01/24: Manual therapy: Sacroiliac joint release in right sidelying  Sacral distraction in right sidelying x10  Cupping to right sacroiliac joint 5 min  Pigeon pose + diaphragmatic breathing 2x61min each side  Seated cat/cow + diaphragmatic breathing 2x10  Quadruped rocking into right hip/left hip + diaphragmatic breathing x10 each   04/09/24: Bike level 2, 4 minutes - PT present to discuss current status  Seated thoracic/lumbar extension over back of chair to promote rib opening and diaphragm release  Standing pull down (RTB)  + single leg march + diaphragmatic breathing  2x10  Seated physioball pelvic tilts + diaphragmatic breathing 2x10  Seated physioball roll outs + diaphragmatic breathing 2x5 each direction (fwd/lateral) Seated physioball adductor stretch + diaphragmatic breathing 2x97min  Seated physioball lateral pelvic tilts + diaphragmatic breathing 2x53min  Seated reclined internal and external rotation of the hips + diaphragmatic breathing  Tall kneeling adductor rocking stretch with physioball + diaphragmatic breathing x10 each side (not added to HEP since she is doing this independently)   04/16/24: Bike level 2, 4 minutes - PT present to discuss  current status  Perineal stretching routine and demonstration on vaginal model for at home practice (3-4 days per week) Tall kneeling thoracic openers at wall + diaphragmatic breathing x10 each side Rocking hamstring/hip flexor stretch with stair rail hand support + diaphragmatic breathing  2x10 each side  Lumbar stretching at countertop x60min  Quadruped rocking into each hip + diaphragmatic breathing 2x10  Cat/cow + diaphragmatic breathing x10  Childs pose + diaphragmatic breathing 2x10   PATIENT EDUCATION:  Education details: relative anatomy and the connection between the diaphragm and pelvic floor Person educated: Patient Education method: Explanation, Demonstration, Tactile cues, Verbal cues, and Handouts Education comprehension: verbalized understanding, returned demonstration, verbal cues required, tactile cues required, and needs further education  HOME EXERCISE PROGRAM: Access Code: [email or web address] URL: [email or web address] Date: 04/09/2024 Prepared by: Celena Domino  Exercises - Seated Pelvic Floor Contraction  - 1 x daily - 7 x weekly - 2 sets - 10 reps - Sidelying Open Book Thoracic Rotation with Knee on Foam Roll  - 1 x daily - 7 x weekly - 2 sets - 10 reps - Quadruped Rocking Slow  - 1 x daily - 7 x weekly - 2 sets - 3 reps - 45s hold - Supine Bilateral Hip Internal Rotation Stretch  - 1 x daily - 7 x weekly - 2 sets - 10 reps - Sidelying Reverse Clamshell  - 1 x daily - 7 x weekly - 2 sets - 10 reps - Seated Hip Adduction Squeeze with Ball  - 1 x daily - 7 x weekly - 2 sets - 10 reps - Bird Dog  - 1 x daily - 7 x weekly - 2 sets - 10 reps - Primal Push Up  - 1 x daily - 7 x weekly - 2 sets - 5 reps - Cat Cow  - 1 x daily - 7 x weekly - 2 sets - 10 reps - Pigeon Pose  - 1 x  daily - 7 x weekly - 2 sets - hold - Seated Piriformis Stretch  - 1 x daily - 7 x weekly - 2 sets - hold - Pelvic Circles on Swiss Ball  - 1 x daily - 7 x weekly - 2 sets - 10 reps -  Seated 3 Way Exercise Ball Roll Out Stretch  - 1 x daily - 7 x weekly - 2 sets - 10 reps - Seated Combined Hip Internal and External Rotation AROM: Hip Switch  - 1 x daily - 7 x weekly - 2 sets - 10 reps - Seated Thoracic Lumbar Extension with Pectoralis Stretch  - 1 x daily - 7 x weekly - 2 sets - 10 reps - Resistance Pulldown with March  - 1 x daily - 7 x weekly - 2 sets - 10 reps - Pelvic Tilt on Swiss Ball  - 1 x daily - 7 x weekly - 2 sets - 10 reps - Seated Lateral Pelvic Tilt on Swiss Ball  - 1 x daily - 7 x weekly - 2 sets - 10 reps - Seated Adductor Stretch on Swiss Ball  - 1 x daily - 7 x weekly - 2 sets - hold  ASSESSMENT:  CLINICAL IMPRESSION: Patient is a 34 y.o. female  who was seen today for physical therapy treatment for antepartum pelvic floor maintenance care during second pregnancy. She arrives with mild low back and hip pain. Lumbar and hip stretching continued today with different versions of each stretch provided for options at home. Pt reports thorough understanding of perineal massage education and we practiced together on vaginal model in clinic to ensure she feels confident with this. Pt tolerated session well and Pt would benefit from additional PT to further address deficits.    OBJECTIVE IMPAIRMENTS: decreased coordination, decreased endurance, decreased mobility, decreased ROM, and decreased strength.   ACTIVITY LIMITATIONS: carrying, lifting, bending, sitting, standing, squatting, stairs, transfers, bed mobility, continence, and toileting  PARTICIPATION LIMITATIONS: cleaning, laundry, interpersonal relationship, driving, shopping, community activity, and yard work  PERSONAL FACTORS: Age, Past/current experiences, and Time since onset of injury/illness/exacerbation are also affecting patient's functional outcome.   REHAB POTENTIAL: Good  CLINICAL DECISION MAKING: Stable/uncomplicated  EVALUATION COMPLEXITY: Low   GOALS: Goals reviewed with patient?  Yes  SHORT TERM GOALS: Target date: 02/13/2024  Pt will be independent with HEP.  Baseline: Goal status: INITIAL  2.  Pt will be independent with use of squatty potty, relaxed toileting mechanics, and improved bowel movement techniques in order to increase ease of bowel movements and complete evacuation.  Baseline:  Goal status: INITIAL  3.  Pt will be independent with the knack, urge suppression technique, and double voiding in order to improve bladder habits and decrease urinary incontinence.  Baseline:  Goal status: INITIAL  LONG TERM GOALS: Target date: 07/16/2024  Pt will be independent with advanced HEP.  Baseline:  Goal status: INITIAL  2.  Pt to demonstrate improved coordination of pelvic floor and breathing mechanics with 10# squat with appropriate synergistic patterns to decrease pain and leakage at least 75% of the time for improved ability to complete a 30 minute workout with strain at pelvic floor and symptoms.   Baseline:  Goal status: INITIAL  3.  Pt will be able to correctly perform diaphragmatic breathing and appropriate pressure management in order to prevent worsening vaginal wall laxity and improve pelvic floor A/ROM.  Baseline:  Goal status: INITIAL  PLAN:  PT FREQUENCY: 1-2x/week  PT  DURATION: 6 months  PLANNED INTERVENTIONS: 97110-Therapeutic exercises, 97530- Therapeutic activity, 97112- Neuromuscular re-education, 97535- Self Care, [id number]- Manual therapy, Patient/Family education, Taping, Joint mobilization, Spinal mobilization, Scar mobilization, Cryotherapy, Moist heat, and Biofeedback  PLAN FOR NEXT SESSION: continued birth and labor prep, toileting mechanics for optimal emptying, core strengthening exercises   Celena JAYSON Domino, PT 04/16/2024, 11:00 AM [facility] [address], [address] [place], [place] [address] Phone # [phone] Fax [phone] "

## 2024-04-17 ENCOUNTER — Other Ambulatory Visit (HOSPITAL_BASED_OUTPATIENT_CLINIC_OR_DEPARTMENT_OTHER): Payer: Self-pay

## 2024-04-17 ENCOUNTER — Ambulatory Visit: Admitting: Family Medicine

## 2024-04-17 VITALS — BP 100/58 | HR 90 | Wt 149.0 lb

## 2024-04-17 DIAGNOSIS — R8271 Bacteriuria: Secondary | ICD-10-CM

## 2024-04-17 DIAGNOSIS — R87612 Low grade squamous intraepithelial lesion on cytologic smear of cervix (LGSIL): Secondary | ICD-10-CM

## 2024-04-17 DIAGNOSIS — Z3480 Encounter for supervision of other normal pregnancy, unspecified trimester: Secondary | ICD-10-CM | POA: Diagnosis not present

## 2024-04-17 DIAGNOSIS — Z3A32 32 weeks gestation of pregnancy: Secondary | ICD-10-CM

## 2024-04-17 MED ORDER — RSV PRE-FUSION F A&B VAC RCMB 120 MCG/0.5ML IM SOLR
0.5000 mL | Freq: Once | INTRAMUSCULAR | 0 refills | Status: AC
  Filled 2024-04-17: qty 0.5, 1d supply, fill #0

## 2024-04-17 NOTE — Progress Notes (Signed)
 "  PRENATAL VISIT NOTE  Subjective:  Joyce Stewart is a 34 y.o. G4P1021 at [redacted]w[redacted]d being seen today for ongoing prenatal care.  She is currently monitored for the following issues for this low-risk pregnancy and has LGSIL on Pap smear of cervix; GBS bacteriuria; and Supervision of other normal pregnancy, antepartum on their problem list.  Patient reports no complaints.  Contractions: Not present. Vag. Bleeding: None.  Movement: Present. Denies leaking of fluid.   The following portions of the patient's history were reviewed and updated as appropriate: allergies, current medications, past family history, past medical history, past social history, past surgical history and problem list.   Objective:   Vitals:   04/17/24 1416 04/17/24 1421  BP: (!) 89/59 (!) 100/58  Pulse: 89 90  Weight: 149 lb (67.6 kg)     Fetal Status:  Fetal Heart Rate (bpm): 134   Movement: Present    General: Alert, oriented and cooperative. Patient is in no acute distress.  Skin: Skin is warm and dry. No rash noted.   Cardiovascular: Normal heart rate noted  Respiratory: Normal respiratory effort, no problems with respiration noted  Abdomen: Soft, gravid, appropriate for gestational age.  Pain/Pressure: Absent     Pelvic: Cervical exam deferred        Extremities: Normal range of motion.  Edema: None  Mental Status: Normal mood and affect. Normal behavior. Normal judgment and thought content.      03/20/2024    8:39 AM 11/22/2023    9:14 AM 12/20/2022   10:08 AM  Depression screen PHQ 2/9  Decreased Interest 0 0 0  Down, Depressed, Hopeless 0 1 0  PHQ - 2 Score 0 1 0  Altered sleeping 0 0 0  Tired, decreased energy 1 1 1   Change in appetite 0 0 0  Feeling bad or failure about yourself  0 0 0  Trouble concentrating 0 0 0  Moving slowly or fidgety/restless 0 0 0  Suicidal thoughts 0 0 0  PHQ-9 Score 1 2  1       Data saved with a previous flowsheet row definition        03/20/2024    8:39 AM  11/22/2023    9:14 AM 12/20/2022   10:09 AM 12/11/2022    9:25 AM  GAD 7 : Generalized Anxiety Score  Nervous, Anxious, on Edge 0 0 1 1  Control/stop worrying 0 0 0 0  Worry too much - different things 0 0 0 0  Trouble relaxing 0 0 0 1  Restless 0 0 0 0  Easily annoyed or irritable 0 0 0 1  Afraid - awful might happen 0 0 1 1  Total GAD 7 Score 0 0 2 4  Anxiety Difficulty    Not difficult at all    Assessment and Plan:  Pregnancy: G4P1021 at [redacted]w[redacted]d 1. [redacted] weeks gestation of pregnancy (Primary)  2. Supervision of other normal pregnancy, antepartum Good fetal movement FHT normal  3. GBS bacteriuria Intrapartum PPx  4. LGSIL on Pap smear of cervix   Preterm labor symptoms and general obstetric precautions including but not limited to vaginal bleeding, contractions, leaking of fluid and fetal movement were reviewed in detail with the patient. Please refer to After Visit Summary for other counseling recommendations.   No follow-ups on file.  Future Appointments  Date Time Provider Department Center  04/23/2024 10:15 AM Gretel Kendall C, PT [facility] None  04/29/2024  2:10 PM Synthia Raisin, CNM [facility] None  04/30/2024  10:15 AM Gretel Kendall C, PT [facility] None  05/15/2024  2:10 PM Coury Grieger J, DO [facility] None  05/21/2024  9:35 AM Abigail Rollo DASEN, MD [facility] None  05/29/2024  2:10 PM Isabela Nardelli J, DO [facility] None  06/05/2024  2:10 PM Jaylanie Boschee J, DO [facility] None    Heith Haigler J Yazlin Ekblad, DO "

## 2024-04-23 ENCOUNTER — Ambulatory Visit (INDEPENDENT_AMBULATORY_CARE_PROVIDER_SITE_OTHER): Admitting: Physical Therapy

## 2024-04-23 DIAGNOSIS — M6281 Muscle weakness (generalized): Secondary | ICD-10-CM

## 2024-04-23 DIAGNOSIS — R279 Unspecified lack of coordination: Secondary | ICD-10-CM

## 2024-04-23 DIAGNOSIS — R293 Abnormal posture: Secondary | ICD-10-CM

## 2024-04-23 DIAGNOSIS — R102 Pelvic and perineal pain unspecified side: Secondary | ICD-10-CM

## 2024-04-23 NOTE — Therapy (Signed)
 " OUTPATIENT PHYSICAL THERAPY FEMALE PELVIC TREATMENT   Patient Name: Joyce Stewart MRN: [id number] DOB:June 14, 1990, 34 y.o., female Today's Date: 04/23/2024  END OF SESSION:  PT End of Session - 04/23/24 1101     Visit Number 8    Number of Visits 12    Date for Recertification  04/17/24    Authorization Type [employer]    PT Start Time 1015    PT Stop Time 1100    PT Time Calculation (min) 45 min    Activity Tolerance Patient tolerated treatment well    Behavior During Therapy [facility] for tasks assessed/performed                 Past Medical History:  Diagnosis Date   Chronic constipation 10/18/2017   Indication for care or intervention in labor or delivery 05/11/2022   Tendonitis of finger 05/15/2019   L thumb   Vaginal Pap smear, abnormal    Past Surgical History:  Procedure Laterality Date   NO PAST SURGERIES     Patient Active Problem List   Diagnosis Date Noted   Supervision of other normal pregnancy, antepartum 11/07/2023   GBS bacteriuria 04/24/2022   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    PCP: Almarie Waddell NOVAK, NP  REFERRING PROVIDER: Abigail Rollo DASEN, MD   REFERRING DIAG: Z34.80 (ICD-10-CM) - Supervision of other normal pregnancy, antepartum  THERAPY DIAG:  Abnormal posture  Muscle weakness (generalized)  Pelvic pain  Unspecified lack of coordination  Rationale for Evaluation and Treatment: Rehabilitation  ONSET DATE: 2025  SUBJECTIVE:                                                                                                                                                                                           SUBJECTIVE STATEMENT: Pt is [redacted] weeks pregnant. She is feeling comfortable these days. She tried the perineal stretching twice and had no discomfort with this. Constipation has been okay, it all depends on the position of the baby that day. No urinary concerns to report. Sleeping okay.   Eval: Patient reports to PFPT after  having success with pelvic PT during her first pregnancy. She is currently pregnant with her second baby (18 weeks), no major discomfort currently, she is feeling relatively comfortable.  Fluid intake: moderate water intake, coconut water   FUNCTIONAL LIMITATIONS: no major functional limitations   PERTINENT HISTORY:  Medications for current condition: no Surgeries: no major surgical history  Other: no Sexual abuse: No  PAIN:  Are you having pain? No NPRS scale: 0/10  PRECAUTIONS: None  RED FLAGS: None   WEIGHT BEARING RESTRICTIONS: No  FALLS:  Has patient fallen in last 6 months? 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, seated for job   ACTIVITY LEVEL : walks 1.5 miles per day, tries to do strength training 1-2x/week (dumbbells, yoga ball, yoga mat, bands)   PLOF: Independent with basic ADLs  PATIENT GOALS: support for the rest of the pregnancy and a baseline for recovering postpartum   BOWEL MOVEMENT: Pain with bowel movement: No Type of bowel movement:Type (Bristol Stool Scale) 3-4, Frequency every other day - always been this way, Strain no, and Splinting no Fully empty rectum: Yes: depends on the day Leakage: No                                                     Caused by: no  Pads: No Fiber supplement/laxative No  URINATION: Pain with urination: No Fully empty bladder: Yes:                                   Post-void dribble: No Stream: Strong Urgency: Yes  Frequency:during the day within normal limits                                                           Nocturia: No   Leakage: every once in a while when urgency is high  Pads/briefs: No  INTERCOURSE:  Ability to have vaginal penetration Yes  Pain with intercourse: Initial Penetration Dryness: Yes  Climax: yes Marinoff Scale: 1/3 Lubricant: yes   PREGNANCY: Vaginal deliveries 1 Tearing Yes: minimal tearing with stitches   Episiotomy No C-section deliveries 0 Currently pregnant Yes: 18 weeks currently   PROLAPSE: None   OBJECTIVE:  Note: Objective measures were completed at Evaluation unless otherwise noted.  PATIENT SURVEYS:  PFIQ-7: 15  COGNITION: Overall cognitive status: Within functional limits for tasks assessed     SENSATION: Light touch: Appears intact  LUMBAR SPECIAL TESTS:  Single leg stance test: Positive  FUNCTIONAL TESTS:  Single leg stance:  Rt: +  Lt: +  Sit-up test: 1/3  Squat: within normal limits  Bed mobility: within normal limits   GAIT: Assistive device utilized: None Comments: mild trendelenburg gait pattern with ambulation   POSTURE: rounded shoulders, forward head, and increased thoracic kyphosis  LUMBARAROM/PROM: within normal limits for all motions with no pain bilaterally   A/PROM A/PROM  Eval (% available)  Flexion   Extension   Right lateral flexion   Left lateral flexion   Right rotation   Left rotation    (Blank rows = not tested)  LOWER EXTREMITY ROM: within normal limits for all motions bilaterally with no pain   LOWER EXTREMITY MMT:  MMT Right eval Left eval  Hip flexion 4 4  Hip extension 3 3  Hip abduction 3 3  Hip adduction 4 4  Hip internal rotation 3 3  Hip external rotation 4 4  Knee flexion 4 4  Knee extension 4 4  Ankle dorsiflexion    Ankle plantarflexion    Ankle inversion  Ankle eversion     (Blank rows = not tested) PALPATION:  General: no tenderness to palpation of bilateral hip flexors or adductors   Pelvic Alignment: within normal limits mild anterior pelvic tilt   Abdominal: abdominal bracing at rest  Diastasis: Yes: at and above umbilicus 2 finger widths  Distortion: No  Breathing: apical breathing pattern  Scar tissue: No                External Perineal Exam: moisture levels intact with sufficient clitoral hood mobility present                             Internal Pelvic Floor: Patient fully  consents to today's internal vaginal examination. She wishes to get an understanding of her baseline pelvic floor function so that she can have optimal outcomes during pregnancy and postpartum. She demonstrates a well timed, moderately strong pelvic floor contraction that is uncoordinated with her diaphragm. She utilizes full pelvic floor range of motion when she pairs a pelvic contraction with an exhale. No pain or palpable trigger points during today's exam.  Patient confirms identification and approves PT to assess internal pelvic floor and treatment Yes No emotional/communication barriers or cognitive limitation. Patient is motivated to learn. Patient understands and agrees with treatment goals and plan. PT explains patient will be examined in standing, sitting, and lying down to see how their muscles and joints work. When they are ready, they will be asked to remove their underwear so PT can examine their perineum. The patient is also given the option of providing their own chaperone as one is not provided in our facility. The patient also has the right and is explained the right to defer or refuse any part of the evaluation or treatment including the internal exam. With the patient's consent, PT will use one gloved finger to gently assess the muscles of the pelvic floor, seeing how well it contracts and relaxes and if there is muscle symmetry. After, the patient will get dressed and PT and patient will discuss exam findings and plan of care. PT and patient discuss plan of care, schedule, attendance policy and HEP activities.  PELVIC MMT:   MMT eval  Vaginal 3/5, 10 quick flicks, 6 second hold  Internal Anal Sphincter   External Anal Sphincter   Puborectalis   Diastasis Recti 2 fingers at and above umbilicus   (Blank rows = not tested)       TONE: Within normal limits bilaterally in superficial and deep pelvic floor musculature   PROLAPSE: None present in hooklying with cough test  TODAY'S  TREATMENT:                                                                                                                              DATE:   04/09/24: Bike level 2, 4 minutes - PT present to discuss current status  Seated thoracic/lumbar extension  over back of chair to promote rib opening and diaphragm release  Standing pull down (RTB)  + single leg march + diaphragmatic breathing  2x10  Seated physioball pelvic tilts + diaphragmatic breathing 2x10  Seated physioball roll outs + diaphragmatic breathing 2x5 each direction (fwd/lateral) Seated physioball adductor stretch + diaphragmatic breathing 2x12min  Seated physioball lateral pelvic tilts + diaphragmatic breathing 2x14min  Seated reclined internal and external rotation of the hips + diaphragmatic breathing  Tall kneeling adductor rocking stretch with physioball + diaphragmatic breathing x10 each side (not added to HEP since she is doing this independently)   04/16/24: Bike level 2, 4 minutes - PT present to discuss current status  Perineal stretching routine and demonstration on vaginal model for at home practice (3-4 days per week) Tall kneeling thoracic openers at wall + diaphragmatic breathing x10 each side Rocking hamstring/hip flexor stretch with stair rail hand support + diaphragmatic breathing  2x10 each side  Lumbar stretching at countertop x48min  Quadruped rocking into each hip + diaphragmatic breathing 2x10  Cat/cow + diaphragmatic breathing x10  Karolynn pose + diaphragmatic breathing 2x10   04/23/24: Bike level 2, 4 minutes - PT present to discuss current status  Seated adductor ball squeeze + transverse abdominis contraction + diaphragmatic breathing 2x60min Seated hip abduction isometric 3 sec hold (BlackTB) + transverse abdominis contraction + diaphragmatic breathing 2x51min  Quadruped hip hikes with no foam block + diaphragmatic breathing 2x10  Standing hip hikes + diaphragmatic breathing 2x10   PATIENT EDUCATION:   Education details: relative anatomy and the connection between the diaphragm and pelvic floor Person educated: Patient Education method: Explanation, Demonstration, Tactile cues, Verbal cues, and Handouts Education comprehension: verbalized understanding, returned demonstration, verbal cues required, tactile cues required, and needs further education  HOME EXERCISE PROGRAM: Access Code: [email or web address] URL: [email or web address] Date: 04/23/2024 Prepared by: Celena Domino  Exercises - Seated Pelvic Floor Contraction  - 1 x daily - 7 x weekly - 2 sets - 10 reps - Sidelying Open Book Thoracic Rotation with Knee on Foam Roll  - 1 x daily - 7 x weekly - 2 sets - 10 reps - Quadruped Rocking Slow  - 1 x daily - 7 x weekly - 2 sets - 3 reps - 45s hold - Supine Bilateral Hip Internal Rotation Stretch  - 1 x daily - 7 x weekly - 2 sets - 10 reps - Sidelying Reverse Clamshell  - 1 x daily - 7 x weekly - 2 sets - 10 reps - Seated Hip Adduction Squeeze with Ball  - 1 x daily - 7 x weekly - 2 sets - 10 reps - Bird Dog  - 1 x daily - 7 x weekly - 2 sets - 10 reps - Primal Push Up  - 1 x daily - 7 x weekly - 2 sets - 5 reps - Cat Cow  - 1 x daily - 7 x weekly - 2 sets - 10 reps - Pigeon Pose  - 1 x daily - 7 x weekly - 2 sets - hold - Seated Piriformis Stretch  - 1 x daily - 7 x weekly - 2 sets - hold - Pelvic Circles on Swiss Ball  - 1 x daily - 7 x weekly - 2 sets - 10 reps - Seated 3 Way Exercise Ball Roll Out Stretch  - 1 x daily - 7 x weekly - 2 sets - 10 reps - Seated Combined Hip Internal and External Rotation AROM: Hip Switch  -  1 x daily - 7 x weekly - 2 sets - 10 reps - Seated Thoracic Lumbar Extension with Pectoralis Stretch  - 1 x daily - 7 x weekly - 2 sets - 10 reps - Resistance Pulldown with March  - 1 x daily - 7 x weekly - 2 sets - 10 reps - Pelvic Tilt on Swiss Ball  - 1 x daily - 7 x weekly - 2 sets - 10 reps - Seated Lateral Pelvic Tilt on Swiss Ball  - 1 x daily -  7 x weekly - 2 sets - 10 reps - Seated Adductor Stretch on Swiss Ball  - 1 x daily - 7 x weekly - 2 sets - hold - Half-Kneeling Thoracic Rotation  - 1 x daily - 7 x weekly - 2 sets - 10 reps - Seated Hip Abduction with Resistance  - 1 x daily - 7 x weekly - 2 sets - 10 reps - Quadruped Hip Hike on Foam  - 1 x daily - 7 x weekly - 2 sets - 10 reps - Standing Hip Hiking  - 1 x daily - 7 x weekly - 2 sets - 10 reps  ASSESSMENT:  CLINICAL IMPRESSION: Patient is a 34 y.o. female  who was seen today for physical therapy treatment for antepartum pelvic floor maintenance care during second pregnancy. She arrives with no pain today, feeling comfortable overall. She has been consistent with perineal stretching at home. Sacroiliac joint mobilization exercises added to HEP today due to increasing ligamentous laxity in pelvic floor causing low back pain. Pt tolerated session well and Pt would benefit from additional PT to further address deficits.    OBJECTIVE IMPAIRMENTS: decreased coordination, decreased endurance, decreased mobility, decreased ROM, and decreased strength.   ACTIVITY LIMITATIONS: carrying, lifting, bending, sitting, standing, squatting, stairs, transfers, bed mobility, continence, and toileting  PARTICIPATION LIMITATIONS: cleaning, laundry, interpersonal relationship, driving, shopping, community activity, and yard work  PERSONAL FACTORS: Age, Past/current experiences, and Time since onset of injury/illness/exacerbation are also affecting patient's functional outcome.   REHAB POTENTIAL: Good  CLINICAL DECISION MAKING: Stable/uncomplicated  EVALUATION COMPLEXITY: Low   GOALS: Goals reviewed with patient? Yes  SHORT TERM GOALS: Target date: 02/13/2024  Pt will be independent with HEP.  Baseline: Goal status: INITIAL  2.  Pt will be independent with use of squatty potty, relaxed toileting mechanics, and improved bowel movement techniques in order to increase ease of bowel  movements and complete evacuation.  Baseline:  Goal status: INITIAL  3.  Pt will be independent with the knack, urge suppression technique, and double voiding in order to improve bladder habits and decrease urinary incontinence.  Baseline:  Goal status: INITIAL  LONG TERM GOALS: Target date: 07/16/2024  Pt will be independent with advanced HEP.  Baseline:  Goal status: INITIAL  2.  Pt to demonstrate improved coordination of pelvic floor and breathing mechanics with 10# squat with appropriate synergistic patterns to decrease pain and leakage at least 75% of the time for improved ability to complete a 30 minute workout with strain at pelvic floor and symptoms.   Baseline:  Goal status: INITIAL  3.  Pt will be able to correctly perform diaphragmatic breathing and appropriate pressure management in order to prevent worsening vaginal wall laxity and improve pelvic floor A/ROM.  Baseline:  Goal status: INITIAL  PLAN:  PT FREQUENCY: 1-2x/week  PT DURATION: 6 months  PLANNED INTERVENTIONS: 97110-Therapeutic exercises, 97530- Therapeutic activity, [id number]- Neuromuscular re-education, 97535- Self Care, [id number]- Manual therapy,  Patient/Family education, Taping, Joint mobilization, Spinal mobilization, Scar mobilization, Cryotherapy, Moist heat, and Biofeedback  PLAN FOR NEXT SESSION: continued birth and labor prep, toileting mechanics for optimal emptying, core strengthening exercises   Celena JAYSON Domino, PT 04/23/2024, 11:02 AM [facility] [address], [address] [place], [place] [address] Phone # [phone] Fax [phone] "

## 2024-04-29 ENCOUNTER — Ambulatory Visit

## 2024-04-29 VITALS — BP 95/53 | HR 76 | Wt 152.1 lb

## 2024-04-29 DIAGNOSIS — Z3480 Encounter for supervision of other normal pregnancy, unspecified trimester: Secondary | ICD-10-CM

## 2024-04-29 DIAGNOSIS — Z3A33 33 weeks gestation of pregnancy: Secondary | ICD-10-CM | POA: Diagnosis not present

## 2024-04-29 DIAGNOSIS — R8271 Bacteriuria: Secondary | ICD-10-CM

## 2024-04-29 NOTE — Progress Notes (Signed)
 "   LOW-RISK PREGNANCY OFFICE VISIT  Patient name: Joyce Stewart MRN [id number]  Date of birth: Feb 03, 1991 Chief Complaint:   Routine Prenatal Visit (33 weeks ROB )  Subjective:   Joyce Stewart is a 34 y.o. [id number] female at [redacted]w[redacted]d with an Estimated Date of Delivery: 06/11/24 being seen today for ongoing management of a low-risk pregnancy aeb has LGSIL on Pap smear of cervix; GBS bacteriuria; and Supervision of other normal pregnancy, antepartum on their problem list.  Patient presents today, alone, with c/o lower back tightness.  She states this is likely d/t fetal position and feels that it is improving with physical therapy.  Patient endorses fetal movement. Patient denies abdominal cramping or contractions.  Patient denies vaginal concerns including abnormal discharge, leaking of fluid, and bleeding. No issues with urination or diarrhea. She does report some mild constipation that she also contributes to fetal position.  She states had a hemorrhoid that hs resolved.   Overall patient states she is pretty good as she notes increased comfort in comparison to first trimester.    Contractions: Not present. Vag. Bleeding: None.  Movement: Present.  Reviewed past medical,surgical, social, obstetrical and family history as well as problem list, medications and allergies.  Objective   Vitals:   04/29/24 1420  BP: (!) 95/53  Pulse: 76  Weight: 152 lb 1.9 oz (69 kg)  Body mass index is 26.11 kg/m.  Total Weight Gain:32 lb 1.9 oz (14.6 kg)         Physical Examination:   General appearance: Well appearing, and in no distress  Mental status: Alert, oriented to person, place, and time  Skin: Warm & dry  Cardiovascular: Normal heart rate noted  Respiratory: Normal respiratory effort, no distress  Abdomen: Soft, gravid, nontender, AGA with Fundal Height: 34 cm  Pelvic: Cervical exam deferred           Extremities: Edema: None  Fetal Status: Fetal Heart Rate (bpm): 152  Movement:  Present   No results found for this or any previous visit (from the past 24 hours).   PHQ 2 & 9 Depression Scale- Over the past 2 weeks, how often have you been bothered by any of the following problems? Little interest or pleasure in doing things: 0 Feeling down, depressed, or hopeless (PHQ Adolescent also includes...irritable): 0 PHQ-2 Total Score: 0 Trouble falling or staying asleep, or sleeping too much: 0 Feeling tired or having little energy: 1 Poor appetite or overeating (PHQ Adolescent also includes...weight loss): 0 Feeling bad about yourself - or that you are a failure or have let yourself or your family down: 0 Trouble concentrating on things, such as reading the newspaper or watching television (PHQ Adolescent also includes...like school work): 0 Moving or speaking so slowly that other people could have noticed. Or the opposite - being so fidgety or restless that you have been moving around a lot more than usual: 0 Thoughts that you would be better off dead, or of hurting yourself in some way: 0 PHQ-9 Total Score: 1        03/20/2024    8:39 AM 11/22/2023    9:14 AM 12/20/2022   10:09 AM 12/11/2022    9:25 AM  GAD 7 : Generalized Anxiety Score  Nervous, Anxious, on Edge 0  0  1  1   Control/stop worrying 0  0  0  0   Worry too much - different things 0  0  0  0   Trouble relaxing 0  0  0  1   Restless 0  0  0  0   Easily annoyed or irritable 0  0  0  1   Afraid - awful might happen 0  0  1  1   Total GAD 7 Score 0 0 2 4  Anxiety Difficulty    Not difficult at all     Data saved with a previous flowsheet row definition   Assessment & Plan   Low-risk pregnancy of a 34 y.o., [id number] at [redacted]w[redacted]d with an Estimated Date of Delivery: 06/11/24   1. Supervision of other normal pregnancy, antepartum -Anticipatory guidance for upcoming appts. -Patient to schedule next appt in 2-3 weeks for an in-person visit.  2. [redacted] weeks gestation of pregnancy -Doing well. -Reassured that  lower back pain anticipated d/t normal physiological changes.  -Discussed preparing for labor and anticipated delivery. Encouraged to listen to body and have prior arrangement for care of toddler.   3. GBS bacteriuria -Plan to treat IP -Discussed collecting GC/CT between 35-36 weeks.      Meds: No orders of the defined types were placed in this encounter.  Labs/procedures today:  Lab Orders  No laboratory test(s) ordered today     Reviewed: Preterm labor symptoms and general obstetric precautions including but not limited to vaginal bleeding, contractions, leaking of fluid and fetal movement were reviewed in detail with the patient.  All questions were answered.  Follow-up: Return in about 3 weeks (around 05/20/2024) for LROB.  No orders of the defined types were placed in this encounter.  Harlene LITTIE Duncans MSN, CNM 04/29/2024  "

## 2024-04-30 ENCOUNTER — Ambulatory Visit: Admitting: Physical Therapy

## 2024-04-30 DIAGNOSIS — M6281 Muscle weakness (generalized): Secondary | ICD-10-CM | POA: Diagnosis not present

## 2024-04-30 DIAGNOSIS — R102 Pelvic and perineal pain unspecified side: Secondary | ICD-10-CM

## 2024-04-30 DIAGNOSIS — R279 Unspecified lack of coordination: Secondary | ICD-10-CM

## 2024-04-30 DIAGNOSIS — R293 Abnormal posture: Secondary | ICD-10-CM

## 2024-04-30 NOTE — Therapy (Signed)
 " OUTPATIENT PHYSICAL THERAPY FEMALE PELVIC TREATMENT   Patient Name: Joyce Stewart MRN: [id number] DOB:[birth year], 34 y.o., female Today's Date: 04/30/2024  END OF SESSION:  PT End of Session - 04/30/24 1107     Visit Number 9    Number of Visits 12    Date for Recertification  04/17/24    Authorization Type [employer]    PT Start Time 1015    PT Stop Time 1100    PT Time Calculation (min) 45 min    Activity Tolerance Patient tolerated treatment well    Behavior During Therapy [facility] for tasks assessed/performed                  Past Medical History:  Diagnosis Date   Chronic constipation 10/18/2017   Indication for care or intervention in labor or delivery 05/11/2022   Tendonitis of finger 05/15/2019   L thumb   Vaginal Pap smear, abnormal    Past Surgical History:  Procedure Laterality Date   NO PAST SURGERIES     Patient Active Problem List   Diagnosis Date Noted   Supervision of other normal pregnancy, antepartum 11/07/2023   GBS bacteriuria 04/24/2022   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    PCP: Almarie Waddell NOVAK, NP  REFERRING PROVIDER: Abigail Rollo DASEN, MD   REFERRING DIAG: Z34.80 (ICD-10-CM) - Supervision of other normal pregnancy, antepartum  THERAPY DIAG:  Abnormal posture  Muscle weakness (generalized)  Pelvic pain  Unspecified lack of coordination  Rationale for Evaluation and Treatment: Rehabilitation  ONSET DATE: 2025  SUBJECTIVE:                                                                                                                                                                                           SUBJECTIVE STATEMENT: Pt is [redacted] weeks pregnant. This week she had some discomfort in the sacroiliac joints this week. No toileting issues this week. She had an OB appt yesterday and everything is going well. No true tailbone pain this week. No back pain to report. The miles circuit is very uncomfortable on her right hip.    Eval: Patient reports to PFPT after having success with pelvic PT during her first pregnancy. She is currently pregnant with her second baby (18 weeks), no major discomfort currently, she is feeling relatively comfortable.  Fluid intake: moderate water intake, coconut water   FUNCTIONAL LIMITATIONS: no major functional limitations   PERTINENT HISTORY:  Medications for current condition: no Surgeries: no major surgical history  Other: no Sexual abuse: No  PAIN:  Are you having pain? No NPRS scale: 0/10  PRECAUTIONS: None  RED FLAGS: None   WEIGHT BEARING RESTRICTIONS: No  FALLS:  Has patient fallen in last 6 months? 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, seated for job   ACTIVITY LEVEL : walks 1.5 miles per day, tries to do strength training 1-2x/week (dumbbells, yoga ball, yoga mat, bands)   PLOF: Independent with basic ADLs  PATIENT GOALS: support for the rest of the pregnancy and a baseline for recovering postpartum   BOWEL MOVEMENT: Pain with bowel movement: No Type of bowel movement:Type (Bristol Stool Scale) 3-4, Frequency every other day - always been this way, Strain no, and Splinting no Fully empty rectum: Yes: depends on the day Leakage: No                                                     Caused by: no  Pads: No Fiber supplement/laxative No  URINATION: Pain with urination: No Fully empty bladder: Yes:                                   Post-void dribble: No Stream: Strong Urgency: Yes  Frequency:during the day within normal limits                                                           Nocturia: No   Leakage: every once in a while when urgency is high  Pads/briefs: No  INTERCOURSE:  Ability to have vaginal penetration Yes  Pain with intercourse: Initial Penetration Dryness: Yes  Climax: yes Marinoff Scale: 1/3 Lubricant: yes   PREGNANCY: Vaginal deliveries  1 Tearing Yes: minimal tearing with stitches  Episiotomy No C-section deliveries 0 Currently pregnant Yes: 18 weeks currently   PROLAPSE: None   OBJECTIVE:  Note: Objective measures were completed at Evaluation unless otherwise noted.  PATIENT SURVEYS:  PFIQ-7: 15  COGNITION: Overall cognitive status: Within functional limits for tasks assessed     SENSATION: Light touch: Appears intact  LUMBAR SPECIAL TESTS:  Single leg stance test: Positive  FUNCTIONAL TESTS:  Single leg stance:  Rt: +  Lt: +  Sit-up test: 1/3  Squat: within normal limits  Bed mobility: within normal limits   GAIT: Assistive device utilized: None Comments: mild trendelenburg gait pattern with ambulation   POSTURE: rounded shoulders, forward head, and increased thoracic kyphosis  LUMBARAROM/PROM: within normal limits for all motions with no pain bilaterally   A/PROM A/PROM  Eval (% available)  Flexion   Extension   Right lateral flexion   Left lateral flexion   Right rotation   Left rotation    (Blank rows = not tested)  LOWER EXTREMITY ROM: within normal limits for all motions bilaterally with no pain   LOWER EXTREMITY MMT:  MMT Right eval Left eval  Hip flexion 4 4  Hip extension 3 3  Hip abduction 3 3  Hip adduction 4 4  Hip internal rotation 3 3  Hip external rotation 4 4  Knee flexion 4 4  Knee extension 4 4  Ankle dorsiflexion  Ankle plantarflexion    Ankle inversion    Ankle eversion     (Blank rows = not tested) PALPATION:  General: no tenderness to palpation of bilateral hip flexors or adductors   Pelvic Alignment: within normal limits mild anterior pelvic tilt   Abdominal: abdominal bracing at rest  Diastasis: Yes: at and above umbilicus 2 finger widths  Distortion: No  Breathing: apical breathing pattern  Scar tissue: No                External Perineal Exam: moisture levels intact with sufficient clitoral hood mobility present                              Internal Pelvic Floor: Patient fully consents to today's internal vaginal examination. She wishes to get an understanding of her baseline pelvic floor function so that she can have optimal outcomes during pregnancy and postpartum. She demonstrates a well timed, moderately strong pelvic floor contraction that is uncoordinated with her diaphragm. She utilizes full pelvic floor range of motion when she pairs a pelvic contraction with an exhale. No pain or palpable trigger points during today's exam.  Patient confirms identification and approves PT to assess internal pelvic floor and treatment Yes No emotional/communication barriers or cognitive limitation. Patient is motivated to learn. Patient understands and agrees with treatment goals and plan. PT explains patient will be examined in standing, sitting, and lying down to see how their muscles and joints work. When they are ready, they will be asked to remove their underwear so PT can examine their perineum. The patient is also given the option of providing their own chaperone as one is not provided in our facility. The patient also has the right and is explained the right to defer or refuse any part of the evaluation or treatment including the internal exam. With the patient's consent, PT will use one gloved finger to gently assess the muscles of the pelvic floor, seeing how well it contracts and relaxes and if there is muscle symmetry. After, the patient will get dressed and PT and patient will discuss exam findings and plan of care. PT and patient discuss plan of care, schedule, attendance policy and HEP activities.  PELVIC MMT:   MMT eval  Vaginal 3/5, 10 quick flicks, 6 second hold  Internal Anal Sphincter   External Anal Sphincter   Puborectalis   Diastasis Recti 2 fingers at and above umbilicus   (Blank rows = not tested)       TONE: Within normal limits bilaterally in superficial and deep pelvic floor musculature   PROLAPSE: None present  in hooklying with cough test  TODAY'S TREATMENT:                                                                                                                              DATE:   04/16/24: Bike level 2, 4 minutes -  PT present to discuss current status  Perineal stretching routine and demonstration on vaginal model for at home practice (3-4 days per week) Tall kneeling thoracic openers at wall + diaphragmatic breathing x10 each side Rocking hamstring/hip flexor stretch with stair rail hand support + diaphragmatic breathing  2x10 each side  Lumbar stretching at countertop x87min  Quadruped rocking into each hip + diaphragmatic breathing 2x10  Cat/cow + diaphragmatic breathing x10  Karolynn pose + diaphragmatic breathing 2x10   04/23/24: Bike level 2, 4 minutes - PT present to discuss current status  Seated adductor ball squeeze + transverse abdominis contraction + diaphragmatic breathing 2x46min Seated hip abduction isometric 3 sec hold (BlackTB) + transverse abdominis contraction + diaphragmatic breathing 2x23min  Quadruped hip hikes with no foam block + diaphragmatic breathing 2x10  Standing hip hikes + diaphragmatic breathing 2x10   04/30/24:  Bike level 2, 4 minutes - PT present to discuss current status  Quadruped rocking into externally rotated hips + diaphragmatic breathing 2x10  Quadruped rocking into internally rotated hips + diaphragmatic breathing 2x10  Quadruped thread the needle + diaphragmatic breathing 2x10  Standing hip shifts holding 10# KB + diaphragmatic breathing 2x10 each side  Seated wide external rotation marching + diaphragmatic breathing 2x10  Quadruped split stance rocking with thoracic rotation + diaphragmatic breathing 2x8 each side  Physioball hip circles bilaterally + diaphragmatic breathing 2x10  quadruped rocking with ball under upper extremities + diaphragmatic breathing 2x10   PATIENT EDUCATION:  Education details: relative anatomy and the connection  between the diaphragm and pelvic floor Person educated: Patient Education method: Explanation, Demonstration, Tactile cues, Verbal cues, and Handouts Education comprehension: verbalized understanding, returned demonstration, verbal cues required, tactile cues required, and needs further education  HOME EXERCISE PROGRAM: Access Code: [email or web address] URL: [email or web address] Date: 04/30/2024 Prepared by: Celena Domino  Exercises - Seated Pelvic Floor Contraction  - 1 x daily - 7 x weekly - 2 sets - 10 reps - Quadruped Rocking Slow  - 1 x daily - 7 x weekly - 2 sets - 3 reps - 45s hold - Supine Bilateral Hip Internal Rotation Stretch  - 1 x daily - 7 x weekly - 2 sets - 10 reps - Sidelying Reverse Clamshell  - 1 x daily - 7 x weekly - 2 sets - 10 reps - Bird Dog  - 1 x daily - 7 x weekly - 2 sets - 10 reps - Primal Push Up  - 1 x daily - 7 x weekly - 2 sets - 5 reps - Cat Cow  - 1 x daily - 7 x weekly - 2 sets - 10 reps - Pigeon Pose  - 1 x daily - 7 x weekly - 2 sets - hold - Pelvic Circles on Swiss Ball  - 1 x daily - 7 x weekly - 2 sets - 10 reps - Seated 3 Way Exercise Emcor Stretch  - 1 x daily - 7 x weekly - 2 sets - 10 reps - Seated Combined Hip Internal and External Rotation AROM: Hip Switch  - 1 x daily - 7 x weekly - 2 sets - 10 reps - Resistance Pulldown with March  - 1 x daily - 7 x weekly - 2 sets - 10 reps - Pelvic Tilt on Swiss Ball  - 1 x daily - 7 x weekly - 2 sets - 10 reps - Seated Lateral Pelvic Tilt on Swiss Ball  - 1 x daily - 7 x weekly -  2 sets - 10 reps - Half-Kneeling Thoracic Rotation  - 1 x daily - 7 x weekly - 2 sets - 10 reps - Seated Hip Abduction with Resistance  - 1 x daily - 7 x weekly - 2 sets - 10 reps - Quadruped Hip Hike on Foam  - 1 x daily - 7 x weekly - 2 sets - 10 reps - Standing Hip Hiking  - 1 x daily - 7 x weekly - 2 sets - 10 reps - Quadruped Full Range Thoracic Rotation with Reach  - 1 x daily - 7 x weekly - 2 sets - 10  reps  ASSESSMENT:  CLINICAL IMPRESSION: Patient is a 34 y.o. female  who was seen today for physical therapy treatment for antepartum pelvic floor maintenance care during second pregnancy. She arrives with no pain today, feeling comfortable overall. She has been consistent with perineal stretching at home. Patient educated on birth prep exercises to perform at home with husband and with use of husband's assistance/hand support. Pt tolerated session well and Pt would benefit from additional PT to further address deficits.    OBJECTIVE IMPAIRMENTS: decreased coordination, decreased endurance, decreased mobility, decreased ROM, and decreased strength.   ACTIVITY LIMITATIONS: carrying, lifting, bending, sitting, standing, squatting, stairs, transfers, bed mobility, continence, and toileting  PARTICIPATION LIMITATIONS: cleaning, laundry, interpersonal relationship, driving, shopping, community activity, and yard work  PERSONAL FACTORS: Age, Past/current experiences, and Time since onset of injury/illness/exacerbation are also affecting patient's functional outcome.   REHAB POTENTIAL: Good  CLINICAL DECISION MAKING: Stable/uncomplicated  EVALUATION COMPLEXITY: Low   GOALS: Goals reviewed with patient? Yes  SHORT TERM GOALS: Target date: 02/13/2024  Pt will be independent with HEP.  Baseline: Goal status: INITIAL  2.  Pt will be independent with use of squatty potty, relaxed toileting mechanics, and improved bowel movement techniques in order to increase ease of bowel movements and complete evacuation.  Baseline:  Goal status: INITIAL  3.  Pt will be independent with the knack, urge suppression technique, and double voiding in order to improve bladder habits and decrease urinary incontinence.  Baseline:  Goal status: INITIAL  LONG TERM GOALS: Target date: 07/16/2024  Pt will be independent with advanced HEP.  Baseline:  Goal status: INITIAL  2.  Pt to demonstrate improved  coordination of pelvic floor and breathing mechanics with 10# squat with appropriate synergistic patterns to decrease pain and leakage at least 75% of the time for improved ability to complete a 30 minute workout with strain at pelvic floor and symptoms.   Baseline:  Goal status: INITIAL  3.  Pt will be able to correctly perform diaphragmatic breathing and appropriate pressure management in order to prevent worsening vaginal wall laxity and improve pelvic floor A/ROM.  Baseline:  Goal status: INITIAL  PLAN:  PT FREQUENCY: 1-2x/week  PT DURATION: 6 months  PLANNED INTERVENTIONS: 97110-Therapeutic exercises, 97530- Therapeutic activity, 97112- Neuromuscular re-education, 97535- Self Care, [id number]- Manual therapy, Patient/Family education, Taping, Joint mobilization, Spinal mobilization, Scar mobilization, Cryotherapy, Moist heat, and Biofeedback  PLAN FOR NEXT SESSION: continued birth and labor prep, toileting mechanics for optimal emptying, core strengthening exercises   Celena JAYSON Domino, PT 04/30/2024, 11:08 AM [facility] [address], [address] [place], [place] [address] Phone # [phone] Fax [phone] "

## 2024-05-15 ENCOUNTER — Encounter: Admitting: Family Medicine

## 2024-05-21 ENCOUNTER — Encounter: Admitting: Obstetrics and Gynecology

## 2024-05-22 ENCOUNTER — Encounter: Admitting: Family Medicine

## 2024-05-29 ENCOUNTER — Ambulatory Visit: Admitting: Physical Therapy

## 2024-05-29 ENCOUNTER — Encounter: Admitting: Obstetrics and Gynecology

## 2024-06-03 ENCOUNTER — Ambulatory Visit: Admitting: Physical Therapy

## 2024-06-05 ENCOUNTER — Encounter: Admitting: Family Medicine
